# Patient Record
Sex: Female | Born: 1985 | Race: White | Hispanic: No | Marital: Single | Smoking: Never smoker
Health system: Southern US, Community
[De-identification: ages and names within clinical notes are randomized; demographics above are authoritative.]

## PROBLEM LIST (undated history)

## (undated) ENCOUNTER — Ambulatory Visit: Admission: EM | Payer: Medicaid Other

## (undated) DIAGNOSIS — F988 Other specified behavioral and emotional disorders with onset usually occurring in childhood and adolescence: Secondary | ICD-10-CM

## (undated) DIAGNOSIS — O9122 Nonpurulent mastitis associated with the puerperium: Secondary | ICD-10-CM

## (undated) DIAGNOSIS — F410 Panic disorder [episodic paroxysmal anxiety] without agoraphobia: Secondary | ICD-10-CM

## (undated) DIAGNOSIS — D6851 Activated protein C resistance: Secondary | ICD-10-CM

## (undated) DIAGNOSIS — L404 Guttate psoriasis: Secondary | ICD-10-CM

## (undated) HISTORY — DX: Activated protein C resistance: D68.51

## (undated) HISTORY — DX: Nonpurulent mastitis associated with the puerperium: O91.22

## (undated) HISTORY — DX: Other specified behavioral and emotional disorders with onset usually occurring in childhood and adolescence: F98.8

## (undated) HISTORY — PX: TONSILLECTOMY: SUR1361

## (undated) HISTORY — PX: SINUS IRRIGATION: SHX2411

## (undated) HISTORY — PX: WISDOM TOOTH EXTRACTION: SHX21

## (undated) HISTORY — DX: Guttate psoriasis: L40.4

---
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
## 2007-06-25 ENCOUNTER — Emergency Department: Payer: Self-pay | Admitting: Emergency Medicine

## 2007-12-04 ENCOUNTER — Emergency Department: Payer: Self-pay | Admitting: Internal Medicine

## 2007-12-26 ENCOUNTER — Ambulatory Visit: Payer: Self-pay | Admitting: Internal Medicine

## 2008-01-10 ENCOUNTER — Ambulatory Visit: Payer: Self-pay

## 2008-03-30 ENCOUNTER — Emergency Department: Payer: Self-pay | Admitting: Emergency Medicine

## 2008-05-11 ENCOUNTER — Emergency Department: Payer: Self-pay | Admitting: Emergency Medicine

## 2008-05-11 ENCOUNTER — Emergency Department (HOSPITAL_COMMUNITY): Admission: EM | Admit: 2008-05-11 | Discharge: 2008-05-11 | Payer: Self-pay | Admitting: Emergency Medicine

## 2008-06-27 ENCOUNTER — Ambulatory Visit: Payer: Self-pay | Admitting: Surgery

## 2008-07-05 ENCOUNTER — Ambulatory Visit: Payer: Self-pay | Admitting: Surgery

## 2008-12-29 ENCOUNTER — Emergency Department (HOSPITAL_COMMUNITY): Admission: EM | Admit: 2008-12-29 | Discharge: 2008-12-30 | Payer: Self-pay | Admitting: Emergency Medicine

## 2009-03-20 ENCOUNTER — Ambulatory Visit: Payer: Self-pay | Admitting: Internal Medicine

## 2009-05-11 ENCOUNTER — Ambulatory Visit: Payer: Self-pay | Admitting: Family Medicine

## 2009-07-22 ENCOUNTER — Emergency Department (HOSPITAL_COMMUNITY): Admission: EM | Admit: 2009-07-22 | Discharge: 2009-07-22 | Payer: Self-pay | Admitting: Emergency Medicine

## 2009-08-17 ENCOUNTER — Ambulatory Visit: Payer: Self-pay | Admitting: Internal Medicine

## 2009-10-18 ENCOUNTER — Ambulatory Visit: Payer: Self-pay | Admitting: Internal Medicine

## 2010-01-06 ENCOUNTER — Emergency Department (HOSPITAL_COMMUNITY): Admission: EM | Admit: 2010-01-06 | Discharge: 2010-01-07 | Payer: Self-pay | Admitting: Emergency Medicine

## 2010-01-08 ENCOUNTER — Emergency Department (HOSPITAL_COMMUNITY): Admission: EM | Admit: 2010-01-08 | Discharge: 2010-01-08 | Payer: Self-pay | Admitting: Emergency Medicine

## 2010-08-28 ENCOUNTER — Ambulatory Visit: Payer: Self-pay | Admitting: Unknown Physician Specialty

## 2010-08-29 ENCOUNTER — Emergency Department: Payer: Self-pay | Admitting: Internal Medicine

## 2010-09-03 ENCOUNTER — Emergency Department: Payer: Self-pay | Admitting: Emergency Medicine

## 2010-09-13 LAB — CULTURE, ROUTINE-ABSCESS

## 2011-03-07 ENCOUNTER — Emergency Department (HOSPITAL_COMMUNITY): Payer: BC Managed Care – PPO

## 2011-03-07 ENCOUNTER — Emergency Department (HOSPITAL_COMMUNITY)
Admission: EM | Admit: 2011-03-07 | Discharge: 2011-03-07 | Disposition: A | Discharge status: Home / Self Care | Payer: BC Managed Care – PPO | Attending: Emergency Medicine | Admitting: Emergency Medicine

## 2011-03-07 DIAGNOSIS — IMO0001 Reserved for inherently not codable concepts without codable children: Secondary | ICD-10-CM | POA: Insufficient documentation

## 2011-03-07 DIAGNOSIS — J3489 Other specified disorders of nose and nasal sinuses: Secondary | ICD-10-CM | POA: Insufficient documentation

## 2011-03-07 DIAGNOSIS — R6883 Chills (without fever): Secondary | ICD-10-CM | POA: Insufficient documentation

## 2011-03-07 DIAGNOSIS — R059 Cough, unspecified: Secondary | ICD-10-CM | POA: Insufficient documentation

## 2011-03-07 DIAGNOSIS — R05 Cough: Secondary | ICD-10-CM | POA: Insufficient documentation

## 2011-03-07 DIAGNOSIS — B86 Scabies: Secondary | ICD-10-CM | POA: Insufficient documentation

## 2011-03-07 DIAGNOSIS — R0602 Shortness of breath: Secondary | ICD-10-CM | POA: Insufficient documentation

## 2011-03-07 DIAGNOSIS — J029 Acute pharyngitis, unspecified: Secondary | ICD-10-CM | POA: Insufficient documentation

## 2011-03-07 DIAGNOSIS — B009 Herpesviral infection, unspecified: Secondary | ICD-10-CM | POA: Insufficient documentation

## 2011-03-07 LAB — CBC
HCT: 40.3 % (ref 36.0–46.0)
MCV: 90.2 fL (ref 78.0–100.0)
Platelets: 217 10*3/uL (ref 150–400)
RBC: 4.47 MIL/uL (ref 3.87–5.11)
WBC: 5.7 10*3/uL (ref 4.0–10.5)

## 2011-03-07 LAB — URINALYSIS, ROUTINE W REFLEX MICROSCOPIC
Bilirubin Urine: NEGATIVE
Hgb urine dipstick: NEGATIVE
Nitrite: NEGATIVE
Specific Gravity, Urine: 1.026 (ref 1.005–1.030)
pH: 5.5 (ref 5.0–8.0)

## 2011-03-07 LAB — POCT I-STAT, CHEM 8
BUN: 13 mg/dL (ref 6–23)
Chloride: 104 mEq/L (ref 96–112)
Creatinine, Ser: 0.8 mg/dL (ref 0.50–1.10)
Potassium: 3.8 mEq/L (ref 3.5–5.1)
Sodium: 140 mEq/L (ref 135–145)

## 2011-05-25 ENCOUNTER — Encounter: Payer: Self-pay | Admitting: Emergency Medicine

## 2011-05-25 ENCOUNTER — Emergency Department (HOSPITAL_COMMUNITY)
Admission: EM | Admit: 2011-05-25 | Discharge: 2011-05-25 | Discharge status: Against Medical Advice | Payer: BC Managed Care – PPO | Attending: Emergency Medicine | Admitting: Emergency Medicine

## 2011-05-25 DIAGNOSIS — Z0389 Encounter for observation for other suspected diseases and conditions ruled out: Secondary | ICD-10-CM | POA: Insufficient documentation

## 2011-05-25 HISTORY — DX: Panic disorder (episodic paroxysmal anxiety): F41.0

## 2011-05-25 NOTE — ED Notes (Signed)
Pt. reports panic/anxiety attack this evening , no meds prior to arrival.

## 2012-02-12 ENCOUNTER — Observation Stay: Payer: Self-pay | Admitting: Obstetrics and Gynecology

## 2012-02-25 ENCOUNTER — Inpatient Hospital Stay: Payer: Self-pay

## 2012-02-25 LAB — CBC WITH DIFFERENTIAL/PLATELET
Basophil %: 0.1 %
Eosinophil %: 0.3 %
HGB: 11.7 g/dL — ABNORMAL LOW (ref 12.0–16.0)
Lymphocyte #: 2.3 10*3/uL (ref 1.0–3.6)
MCH: 30.7 pg (ref 26.0–34.0)
MCV: 89 fL (ref 80–100)
Monocyte #: 1.1 x10 3/mm — ABNORMAL HIGH (ref 0.2–0.9)
RBC: 3.81 10*6/uL (ref 3.80–5.20)

## 2012-02-27 DIAGNOSIS — O9122 Nonpurulent mastitis associated with the puerperium: Secondary | ICD-10-CM

## 2012-02-27 HISTORY — DX: Nonpurulent mastitis associated with the puerperium: O91.22

## 2012-02-27 LAB — HEMATOCRIT: HCT: 28.6 % — ABNORMAL LOW

## 2012-03-13 ENCOUNTER — Inpatient Hospital Stay: Payer: Self-pay

## 2012-03-13 LAB — BASIC METABOLIC PANEL
Anion Gap: 10 (ref 7–16)
BUN: 10 mg/dL (ref 7–18)
Chloride: 104 mmol/L (ref 98–107)
Glucose: 94 mg/dL (ref 65–99)
Potassium: 2.8 mmol/L — ABNORMAL LOW (ref 3.5–5.1)
Sodium: 141 mmol/L (ref 136–145)

## 2012-03-13 LAB — URINALYSIS, COMPLETE
Nitrite: NEGATIVE
Ph: 6 (ref 4.5–8.0)
Specific Gravity: 1.012 (ref 1.003–1.030)
Squamous Epithelial: 1

## 2012-03-14 LAB — BASIC METABOLIC PANEL
Anion Gap: 10 (ref 7–16)
Calcium, Total: 8.2 mg/dL — ABNORMAL LOW (ref 8.5–10.1)
Co2: 26 mmol/L (ref 21–32)
EGFR (African American): >60
EGFR (Non-African Amer.): >60
Glucose: 140 mg/dL — ABNORMAL HIGH (ref 65–99)
Osmolality: 285 (ref 275–301)

## 2012-03-14 LAB — CBC WITH DIFFERENTIAL/PLATELET
Basophil #: 0 10*3/uL (ref 0.0–0.1)
Eosinophil %: 1.2 %
Lymphocyte %: 7.3 %
MCV: 88 fL (ref 80–100)
Monocyte %: 5.5 %
Neutrophil %: 85.8 %
Platelet: 199 10*3/uL (ref 150–440)
RBC: 3.34 10*6/uL — ABNORMAL LOW (ref 3.80–5.20)
RDW: 13.9 % (ref 11.5–14.5)
WBC: 11.1 10*3/uL — ABNORMAL HIGH (ref 3.6–11.0)

## 2012-03-16 LAB — ELECTROLYTE PANEL
Anion Gap: 8 (ref 7–16)
Chloride: 109 mmol/L — ABNORMAL HIGH (ref 98–107)
Co2: 26 mmol/L (ref 21–32)
Potassium: 3.6 mmol/L (ref 3.5–5.1)
Sodium: 143 mmol/L (ref 136–145)

## 2012-03-19 LAB — CULTURE, BLOOD (SINGLE)

## 2012-06-25 ENCOUNTER — Emergency Department: Payer: Self-pay | Admitting: Emergency Medicine

## 2012-06-28 HISTORY — PX: MANDIBLE SURGERY: SHX707

## 2012-06-29 ENCOUNTER — Emergency Department: Payer: Self-pay | Admitting: Emergency Medicine

## 2012-06-29 LAB — COMPREHENSIVE METABOLIC PANEL
Albumin: 4.4 g/dL (ref 3.4–5.0)
Anion Gap: 11 (ref 7–16)
Creatinine: 0.66 mg/dL (ref 0.60–1.30)
Glucose: 112 mg/dL — ABNORMAL HIGH (ref 65–99)
SGOT(AST): 27 U/L (ref 15–37)
Total Protein: 8.1 g/dL (ref 6.4–8.2)

## 2012-06-29 LAB — URINALYSIS, COMPLETE
Bacteria: NONE SEEN
Blood: NEGATIVE
Glucose,UR: NEGATIVE mg/dL (ref 0–75)
Nitrite: NEGATIVE
RBC,UR: 1 /HPF (ref 0–5)
Squamous Epithelial: 3

## 2012-06-29 LAB — CBC
HCT: 41.8 % (ref 35.0–47.0)
HGB: 14.4 g/dL (ref 12.0–16.0)
MCH: 30.3 pg (ref 26.0–34.0)
MCHC: 34.5 g/dL (ref 32.0–36.0)
MCV: 88 fL (ref 80–100)
RBC: 4.75 10*6/uL (ref 3.80–5.20)

## 2012-06-29 LAB — LIPASE, BLOOD: Lipase: 94 U/L (ref 73–393)

## 2013-03-23 ENCOUNTER — Emergency Department (HOSPITAL_COMMUNITY)
Admission: EM | Admit: 2013-03-23 | Discharge: 2013-03-23 | Disposition: A | Discharge status: Home / Self Care | Payer: Managed Care, Other (non HMO) | Attending: Emergency Medicine | Admitting: Emergency Medicine

## 2013-03-23 ENCOUNTER — Encounter (HOSPITAL_COMMUNITY): Payer: Self-pay | Admitting: *Deleted

## 2013-03-23 DIAGNOSIS — Z79899 Other long term (current) drug therapy: Secondary | ICD-10-CM | POA: Insufficient documentation

## 2013-03-23 DIAGNOSIS — F410 Panic disorder [episodic paroxysmal anxiety] without agoraphobia: Secondary | ICD-10-CM | POA: Insufficient documentation

## 2013-03-23 DIAGNOSIS — F17200 Nicotine dependence, unspecified, uncomplicated: Secondary | ICD-10-CM | POA: Insufficient documentation

## 2013-03-23 DIAGNOSIS — R21 Rash and other nonspecific skin eruption: Secondary | ICD-10-CM

## 2013-03-23 MED ORDER — IBUPROFEN 600 MG PO TABS: 600.0000 mg | ORAL_TABLET | Freq: Four times a day (QID) | ORAL | Status: DC | PRN

## 2013-03-23 MED ORDER — HYDROCORTISONE 1 % EX CREA: TOPICAL_CREAM | CUTANEOUS | Status: DC

## 2013-03-23 NOTE — ED Provider Notes (Signed)
CSN: [id number]     Arrival date & time 03/23/13  0006 History   First MD Initiated Contact with Patient 03/23/13 0049     Chief Complaint  Patient presents with  . Leg Pain  . Arm Pain  . Rash    HPI Patient presents to the emergency room with complaints of pain in her bilateral inner thighs as well as her left forearm. Patient states she felt like her veins were urinated. She palpates them and has some discomfort. She has not noticed any redness or swelling. She has not had any chest pain or shortness of breath.  She was reading on the Internet about blood clots and this concerned her so she came to the emergency department. Patient also has had this slight rash in the left antecubital fossa. She has a history of eczema but has never had a rash here before. Past Medical History  Diagnosis Date  . Panic attack   . Anxiety attack    Past Surgical History  Procedure Laterality Date  . Tonsillectomy    . Sinus irrigation     No family history on file. History  Substance Use Topics  . Smoking status: Current Every Day Smoker  . Smokeless tobacco: Not on file  . Alcohol Use: Yes     Comment: occasional   OB History   Grav Para Term Preterm Abortions TAB SAB Ect Mult Living                 Review of Systems  All other systems reviewed and are negative.    Allergies  Doxycycline  Home Medications   Current Outpatient Rx  Name  Route  Sig  Dispense  Refill  . amphetamine-dextroamphetamine (ADDERALL) 10 MG tablet   Oral   Take 10 mg by mouth 2 (two) times daily.         . clonazePAM (KLONOPIN) 0.5 MG tablet   Oral   Take 0.5 mg by mouth 2 (two) times daily as needed for anxiety.         . hydrocortisone cream 1 %      Apply to affected area 2 times daily   15 g   0   . ibuprofen (ADVIL,MOTRIN) 600 MG tablet   Oral   Take 1 tablet (600 mg total) by mouth every 6 (six) hours as needed for pain.   30 tablet   0    BP 123/59  Pulse 86  Temp(Src) 98.2 F  (36.8 C) (Oral)  Resp 15  Ht 5\' 5"  (1.651 m)  Wt 145 lb (65.772 kg)  BMI 24.13 kg/m2  SpO2 100%  LMP 02/19/2013 Physical Exam  Nursing note and vitals reviewed. Constitutional: She appears well-developed and well-nourished. No distress.  HENT:  Head: Normocephalic and atraumatic.  Right Ear: External ear normal.  Left Ear: External ear normal.  Eyes: Conjunctivae are normal. Right eye exhibits no discharge. Left eye exhibits no discharge. No scleral icterus.  Neck: Neck supple. No tracheal deviation present.  Cardiovascular: Normal rate, regular rhythm and intact distal pulses.   Pulmonary/Chest: Effort normal and breath sounds normal. No stridor. No respiratory distress. She has no wheezes. She has no rales.  Abdominal: Soft. Bowel sounds are normal. She exhibits no distension. There is no tenderness. There is no rebound and no guarding.  Musculoskeletal: She exhibits no edema and no tenderness.  Neurological: She is alert. She has normal strength. No sensory deficit. Cranial nerve deficit:  no gross defecits noted. She  exhibits normal muscle tone. She displays no seizure activity. Coordination normal.  Skin: Skin is warm and dry. No rash noted.  Psychiatric: She has a normal mood and affect.    ED Course  Procedures (including critical care time) Labs Review Labs Reviewed - No data to display Imaging Review No results found.  MDM   1. Rash    Patient has no erythema or tenderness on exam. She has no cords. I doubt venous thrombosis. I doubt thrombophlebitis. I will give her prescription for hydrocortisone for her rash. At this time I doubt any acute emergency medical condition. I recommend follow up with her primary Dr.    Celene Kras, MD 03/23/13 [phone]

## 2013-03-23 NOTE — ED Notes (Signed)
Discharge e-sign pad not working pt unable to sign discharge instructions and Rx given to pt.

## 2013-03-23 NOTE — ED Notes (Signed)
Pt states yesterday started having leg pain, then today started having L lower inner "vein" pain, states she read online it could be a blood clot and w/ her anxiety she's worried, pt also states she has a rash that is itchy.

## 2013-05-19 ENCOUNTER — Emergency Department: Payer: Self-pay | Admitting: Emergency Medicine

## 2013-05-19 LAB — COMPREHENSIVE METABOLIC PANEL
Albumin: 4 g/dL (ref 3.4–5.0)
Alkaline Phosphatase: 93 U/L
Anion Gap: 1 — ABNORMAL LOW (ref 7–16)
BUN: 20 mg/dL — ABNORMAL HIGH (ref 7–18)
Bilirubin,Total: 0.4 mg/dL (ref 0.2–1.0)
Calcium, Total: 9 mg/dL (ref 8.5–10.1)
Chloride: 104 mmol/L (ref 98–107)
Co2: 32 mmol/L (ref 21–32)
EGFR (African American): >60
EGFR (Non-African Amer.): >60
SGOT(AST): 26 U/L (ref 15–37)
SGPT (ALT): 28 U/L (ref 12–78)
Sodium: 137 mmol/L (ref 136–145)
Total Protein: 7.5 g/dL (ref 6.4–8.2)

## 2013-05-19 LAB — URINALYSIS, COMPLETE
Bacteria: NONE SEEN
Bilirubin,UR: NEGATIVE
Glucose,UR: NEGATIVE mg/dL (ref 0–75)
Leukocyte Esterase: NEGATIVE
RBC,UR: 1 /HPF (ref 0–5)
WBC UR: <1 /HPF (ref 0–5)

## 2013-05-19 LAB — CBC
HGB: 12.4 g/dL (ref 12.0–16.0)
MCHC: 34.3 g/dL (ref 32.0–36.0)
MCV: 90 fL (ref 80–100)
WBC: 4.6 10*3/uL (ref 3.6–11.0)

## 2013-08-22 ENCOUNTER — Emergency Department: Payer: Self-pay | Admitting: Emergency Medicine

## 2013-11-08 DIAGNOSIS — G8929 Other chronic pain: Secondary | ICD-10-CM | POA: Insufficient documentation

## 2013-11-08 DIAGNOSIS — R109 Unspecified abdominal pain: Secondary | ICD-10-CM

## 2014-09-03 ENCOUNTER — Ambulatory Visit: Payer: Self-pay | Admitting: Internal Medicine

## 2014-09-13 ENCOUNTER — Ambulatory Visit: Payer: Self-pay | Admitting: Internal Medicine

## 2014-09-27 ENCOUNTER — Ambulatory Visit
Admit: 2014-09-27 | Disposition: A | Discharge status: Home / Self Care | Payer: Self-pay | Attending: Internal Medicine | Admitting: Internal Medicine

## 2014-10-14 ENCOUNTER — Encounter
Admit: 2014-10-14 | Disposition: A | Discharge status: Home / Self Care | Payer: Self-pay | Attending: Family Medicine | Admitting: Family Medicine

## 2014-10-15 NOTE — Op Note (Signed)
PATIENT NAME:  Cynthia Davidson, Cynthia Davidson MR#:  [id number] DATE OF BIRTH:  27-Oct-1985  DATE OF PROCEDURE:  02/26/2012  PREOPERATIVE DIAGNOSES:  71. 29 year old G1 at 40 weeks, 2 days gestation.  2. Failed induction of labor secondary to presumed macrosomia.  3. Active phase arrest secondary to cephalopelvic portion.   POSTOPERATIVE DIAGNOSES: 60. 28 year old G1 at 40 weeks, 2 days gestation.  2. Failed induction of labor secondary to presumed macrosomia.  3. Active phase arrest secondary to cephalopelvic portion.   PROCEDURE PERFORMED: Primary low transverse Cesarean section via Pfannenstiel skin incision.   ANESTHESIA USED: Spinal.   PRIMARY SURGEON: Stoney Bang. Georgianne Fick, MD    ASSISTANT: Dalia Heading, CNM   ESTIMATED BLOOD LOSS: 700 mL.  OPERATIVE FLUIDS: 1400 mL of Crystalloid.   URINE OUTPUT: 200 mL.   COMPLICATIONS: None.   DRAINS OR TUBES: Foley to gravity drainage; On-Q catheter.   IMPLANTS: None.   INTRAOPERATIVE FINDINGS: Normal tubes, ovaries, and uterus, infant in the OA position. Delivery resulted in the birth of a liveborn female infant weighing 3680 grams, 8 pounds 2 ounces, Apgars 9 and 9.   SPECIMENS REMOVED: None.   CONDITION FOLLOWING PROCEDURE: Stable.   PROCEDURE IN DETAIL: Risks, benefits, and alternatives of the procedure were discussed with the patient prior to proceeding to the OR. The patient was administered spinal anesthesia and then positioned in the supine position. She was prepped and draped in the usual sterile fashion. Prior to proceeding with the case, local anesthetic was tested and noted to be adequate. A Pfannenstiel skin incision was made 2 cm above the pubic symphysis, carried down sharply to the level of the rectus fascia using the knife. The fascia was incised in the midline using the knife. The fascial incision was then extended using Mayo scissors. The superior border of the rectus fascia was grasped with two Kocher clamps. The underlying  rectus muscle was bluntly dissected off the fascia and the median raphe incised using Mayo scissors. The same procedure was then repeated for the inferior border of the rectus fascia bringing it off the rectus muscle in a similar manner. Next, the midline was identified and the peritoneum was entered bluntly. The peritoneal window was then extended using manual traction. A bladder blade was placed. A bladder flap was created using Metzenbaum scissors and developed digitally. The bladder blade was then replaced. Following this a hysterotomy incision was made on the uterus using a scalpel. The uterus was entered bluntly using the operator's finger. Upon placing the operator's hand into the hysterotomy incision, infant was noted to be in the OA position. The vertex was grasped, flexed, brought to the incision, and delivered atraumatically using fundal pressure. The remainder of the body was delivered without difficulty. The infant was suctioned, the cord was clamped and cut, and the infant was passed to the awaiting pediatrician. Next, the cord blood was obtained. The placenta was delivered using manual extraction. The uterus was exteriorized, wiped clean of clots and debris, and the hysterotomy incision repaired using a two layer closure of 0 Vicryl with the first being a running locked and the second a vertical imbricating. Following closure of the hysterotomy, the pelvis was irrigated with warm saline. The uterus was replaced into the abdomen. The hysterotomy incision was inspected and noted to be hemostatic. The peritoneum was then closed using a 2-0 Vicryl in a running fashion. The superior border of the rectus fascia was grasped with a Kocher clamp and the On-Q trocars were passed approximately 4  cm above the superior border of the Pfannenstiel skin incision in the midline. The trocars were removed and the On-Q catheters threaded through the introducers after which the introducers were removed. The rectus muscle  was inspected and noted to be hemostatic. The fascia was closed using a #1 looped PDS in a running fashion. Following fascial closure, the subcutaneous tissue was irrigated and hemostasis was achieved using the Bovie. Skin was closed using a 4-0 Monocryl in a subcuticular fashion. The Pfannenstiel skin incision was dressed with Steri-Strips. The On-Q catheters were dressed with Dermabond and then Steri-Strips. A Tegaderm was placed over the On-Q catheters. Each On-Q catheter was then bolused with 5 mL of 0.5% bupivacaine each. Sponge, needle, and instrument counts were correct x2. The patient tolerated the procedure well and was taken to recovery room in stable condition.    ____________________________ Stoney Bang. Georgianne Fick, MD ams:drc D: 02/28/2012 09:19:00 ET T: 02/29/2012 08:42:03 ET JOB#: [id number]  cc: Stoney Bang. Georgianne Fick, MD, <Dictator> Dorthula Nettles MD ELECTRONICALLY SIGNED 03/21/2012 21:13

## 2014-10-15 NOTE — Consult Note (Signed)
PATIENT NAME:  Cynthia Davidson, Cynthia Davidson MR#:  [id number] DATE OF BIRTH:  01-19-1986  DATE OF CONSULTATION:  03/13/2012  REFERRING PHYSICIAN:   CONSULTING PHYSICIAN:  Jerrol Banana. Burt Knack, MD  CHIEF COMPLAINT: Left breast pain.   HISTORY OF PRESENT ILLNESS: This is a patient who is two weeks postpartum who since last Thursday has had increasing left breast pain and tenderness. She denies discharge other than normal lactational products. She has stopped breast-feeding due to being on antibiotics. She has never had anything like this before. She describes fevers to 104, but that is not documented here in the hospital. She states that she believes her fever broke today and she has been profusely sweating.  Of note, she has had a left breast biopsy in the past, excisional in nature, unknown pathology but presumed benign.    PAST MEDICAL HISTORY: None.   PAST SURGICAL HISTORY: Cesarean section.   ALLERGIES: Doxycycline.   FAMILY HISTORY: Noncontributory.   SOCIAL HISTORY: The patient does not drink alcohol.   REVIEW OF SYSTEMS: 10-system review was performed and negative with the exception of that mentioned in the history of present illness including fevers.   PHYSICAL EXAMINATION:  GENERAL: Healthy, but uncomfortable-appearing Caucasian female patient. Her baby is in the room with her. Temperature of 98.8 and 98.6, pulse 107, respirations 20, blood pressure 91/61.   HEENT: No scleral icterus.   NECK: No palpable neck nodes.   BREASTS: Examination shows erythema and diffuse tenderness with probable fluctuance laterally beneath a well-healed prior biopsy scar. Some of this erythema extends to the left axilla, diffusely tender.   EXTREMITIES: Minimal edema.   NEUROLOGIC: Grossly intact.   INTEGUMENT: No jaundice. Cellulitis is noted as above.   LABORATORY, DIAGNOSTIC, AND RADIOLOGICAL DATA: Laboratory values were being drawn as I was seeing the patient including blood cultures and presumed  standard blood panels. They are not returned yet and will be reviewed shortly.   ASSESSMENT AND PLAN: Apparently Dr. Marina Gravel had spoken to the patient's obstetrician concerning postpartum mastitis. She is seen in the hospital, has been admitted by the obstetrician for a diagnosis of postpartum mastitis on the left. She clearly has that, and I believe that she likely has an abscess cavity. She has been on antibiotics since Friday with no improvement other than her breaking fevers, but worsening pain. In this situation I would recommend and have recommended to the patient and her friend or family member that we proceed with incision and drainage tomorrow morning. She is very reluctant to consider surgery at this point. She does not want to have surgery and wishes to attempt continued IV antibiotic therapy with the hopes that this clears up with IV antibiotics.   I discussed with the patient the possibility of doing that, but the likelihood is that this is postpartum mastitis with an abscess cavity and an incision and drainage will need to be done, and proper drainage could result in a quicker resolution to this problem. Again, she is reluctant to do that. I did not consent her for surgery, but I will make her n.p.o. after midnight and plan surgery for her tomorrow if she does not drastically and dramatically improve. Additionally I will order an ultrasound for first thing in the morning to confirm the presence of an abscess cavity, which is likely. The patient was in agreement with this plan and did not want to consent to surgery at this point, and will be re-evaluated first thing in the morning by Dr. Marina Gravel.  ____________________________ Jerrol Banana Burt Knack, MD rec:bjt D:  03/13/2012 21:33:24 ET         T: 03/14/2012 09:57:35 ET         JOB#: [id number]   Florene Glen MD ELECTRONICALLY SIGNED 03/14/2012 21:01

## 2014-10-15 NOTE — Consult Note (Signed)
Brief Consult Note: Diagnosis: lt ppartum mastitis.   Patient was seen by consultant.   Consult note dictated.   Recommend to proceed with surgery or procedure.   Orders entered.   Comments: pt reluctant to consider surgery at this time. wants to see if  IV abx will improve mastitis. Will make ready for I&D but if improved will hold off. Decision to be made in am. Will order U/S of breast as well but likely abscess cavity present.  Electronic Signatures: Florene Glen (MD)  (Signed 16-Sep-13 21:26)  Authored: Brief Consult Note   Last Updated: 16-Sep-13 21:26 by Florene Glen (MD)

## 2014-11-05 NOTE — H&P (Signed)
L&D Evaluation:  History:   HPI 29yo G5P0040 at [redacted]w[redacted]d by first trimester U/S presents for scheduled IOL for post-dates and LGA baby.  PNC at [facility], uncomplicated.    PNL: O+ / VNI / RI / Hep B - / HIV - / RPR NR / GBS -    Patient's Medical History anxiety - not on meds    Patient's Surgical History D&C  T&A, breast biopsy x 2    Medications Pre Natal Vitamins    Allergies doxycyline    Social History none    Family History Non-Contributory   ROS:   ROS All systems were reviewed.  HEENT, CNS, GI, GU, Respiratory, CV, Renal and Musculoskeletal systems were found to be normal.   Exam:   Vital Signs stable    General no apparent distress    Mental Status clear    Chest clear    Heart normal sinus rhythm    Abdomen gravid, non-tender    Estimated Fetal Weight Average for gestational age, 8#    Fetal Position vtx    Pelvic 1 / 64 / -3    Mebranes Intact    FHT Description 140 moderate variability, + accels, no decels    Ucx every 2 mins   Impression:   Impression 29yo G5P0040 at [redacted]w[redacted]d presents for scheduled IOL   Plan:   Plan contracting too much for cervidil, will start with foley/pitocin, reviewed risks/benefits of IOL, pt understands and accepts    Comments VNI - will need PP varicella vaccine, s/p TDAP 01/10/12   Electronic Signatures: Thurmond Butts, Elinor Parkinson (MD)  (Signed 30-Aug-13 21:54)  Authored: L&D Evaluation   Last Updated: 30-Aug-13 21:54 by Thurmond Butts, Elinor Parkinson (MD)

## 2014-11-12 ENCOUNTER — Ambulatory Visit: Payer: No Typology Code available for payment source | Attending: Family Medicine

## 2014-11-12 DIAGNOSIS — X58XXXD Exposure to other specified factors, subsequent encounter: Secondary | ICD-10-CM | POA: Insufficient documentation

## 2014-11-12 DIAGNOSIS — S39012D Strain of muscle, fascia and tendon of lower back, subsequent encounter: Secondary | ICD-10-CM | POA: Insufficient documentation

## 2014-11-12 DIAGNOSIS — M6281 Muscle weakness (generalized): Secondary | ICD-10-CM | POA: Diagnosis not present

## 2014-11-12 DIAGNOSIS — M545 Low back pain: Secondary | ICD-10-CM | POA: Insufficient documentation

## 2014-11-12 NOTE — Patient Instructions (Addendum)
Trunk: Rotation   Lie on back on firm, flat surface with knees bent. Keep head and shoulders flat, slowly lower knees to floor. May also do with legs straight. Lift one at a time up and across to touch floor. Hold _5___ seconds. Repeat ___10_ times, alternating sides. Do _3___ sessions per day. CAUTION: Movement should be gentle, steady and slow.  Copyright  VHI. All rights reserved.    Also gave single leg bridge (using L LE for L glute max and R hip flexors to correct posterior nutation R innominate, and anterior nutation L innominate) and R S/L open book exercise as part of her HEP. Patient demonstrated and verbalized understanding.   Improved exercise technique, movement at target joints, use of target muscles after mod verbal, visual, tactile cues.

## 2014-11-12 NOTE — Therapy (Signed)
[facility] [address] [place], [address] Phone: [phone]   Fax:  [phone]  Physical Therapy Treatment  Patient Details  Name: Cynthia Davidson MRN: [id number] Date of Birth: 10-31-1985 Referring Provider:  Birdie Sons, MD  Encounter Date: 11/12/2014      PT End of Session - 11/12/14 1441    Visit Number 4   Number of Visits 12   Date for PT Re-Evaluation 12/10/14   PT Start Time 4034   PT Stop Time 1545   PT Time Calculation (min) 67 min   Activity Tolerance Patient tolerated treatment well;No increased pain   Behavior During Therapy [facility] for tasks assessed/performed      Past Medical History  Diagnosis Date  . Panic attack   . Anxiety attack     Past Surgical History  Procedure Laterality Date  . Tonsillectomy    . Sinus irrigation    . Wisdom tooth extraction    . Biopsy breast      There were no vitals filed for this visit.  Visit Diagnosis:  Low back pain, unspecified back pain laterality, with sciatica presence unspecified - Plan: PT plan of care cert/re-cert  Muscle weakness (generalized) - Plan: PT plan of care cert/re-cert  Lumbar spine strain, subsequent encounter - Plan: PT plan of care cert/re-cert      Subjective Assessment - 11/12/14 1442    Subjective 2/10 back pain current, 5/10 at worst past 2-3 days (when patient woke up in the morning; patient sleeps on her side). Also goes to a chiropractor which helps her back. Back feel stiff especially the upper back.    Patient Stated Goals "I just want my pain to go away."   Currently in Pain? Yes   Pain Score 2    Pain Location Back            [facility] PT Assessment - 11/12/14 1637    Assessment   Medical Diagnosis Strain of muscle, fascia and tendon of lower back subsequent encounter; low back pain; muscle weakness   Onset Date 09/19/14   Prior Therapy Patient currently participates in chiropractic care as  well   Precautions   Precaution Comments No known precautions   Restrictions   Other Position/Activity Restrictions No known restrictions   Balance Screen   Has the patient fallen in the past 6 months No   Has the patient had a decrease in activity level because of a fear of falling?  No   Is the patient reluctant to leave their home because of a fear of falling?  No   Prior Function   Vocation Requirements full function   Observation/Other Assessments   Observations Positive long sit test suggesing anterior nutation of L innominate, and posterior nutation of R innominate; (-) repeated flexion test, (-) slump test bilaterally; SLR hip flexion suggests bilateral neural tension   Oswestry Disability Index  during initial evaluation: 32% (moderate disability)   AROM   Overall AROM Comments During initial evaluation: lumbar flexion WFL but R paraspinals more prominent suggesing possible scoliosis; lumbar extension limited with decreaesd thoracic extension. Lumbar side bending and rotation WFL both R and L directions and without reproduction of symptoms.    Strength   Overall Strength Comments During initial evaluation: hip abduction 4+/5 bilaterally, glute max extension 4/5 bilaterally   Ambulation/Gait   Gait Comments increased R pelvic rotation           [facility] Adult  PT Treatment/Exercise - 11/12/14 1511    Knee/Hip Exercises: Supine   Other Supine Knee Exercises Directed patient with supine bridge with horizontal towel roll upper back 10x2, then with vertical towel roll R paraspinals 10x, supine lower trunk rotation 10x2 with 5 second holds, single leg bridge using L LE with R hip flexed as high as L thigh 10x2   Knee/Hip Exercises: Sidelying   Other Sidelying Knee Exercises R S/L open books 10x2   Other Sidelying Knee Exercises Reviewed current status/progress with LE strength with patient.    Knee/Hip Exercises: Prone   Other Prone Exercises Directed patient with quadruped L trunk  rotation with static head 10x3, planks 15 seconds x2, and 30 seconds x1   Manual Therapy   Manual therapy comments Medial glide to L patella with knee flexed grade 4  Decreased L knee popping with supine bridge afterwards             PT Education - 11/12/14 1558    Education provided Yes   Education Details Ther-ex, HEP, pelvic positionning, thoracic and lumbar trunk posture.    Person(s) Educated Patient   Methods Explanation;Demonstration;Tactile cues;Verbal cues;Handout   Comprehension Verbalized understanding;Returned demonstration             PT Long Term Goals - 11/12/14 1606    PT LONG TERM GOAL #1   Title Patient will be independent with her home exercise program to improve ability to tolerate prolonged standing, sitting, as well as to improve ability to lift her son    Time 4   Period Weeks   Status On-going   PT LONG TERM GOAL #2   Title Patient will report 3/10 or less back pain at worst to improve ability to tolerate prolonged sitting to study, as well as to improve ability 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    Time 4   Period Weeks   Status On-going   PT LONG TERM GOAL #3   Title Patient will report being able to sit for at least 1.5 hours or greater to improve ability to study for her classes    Time 4   Period Weeks   Status On-going   PT LONG TERM GOAL #4   Title Patient will improve Modified Oswestry Low Back Pain Disability questionnaire by at least 6 points as a demonstration of improved function   Time 4   Period Weeks   Status On-going               Plan - 11/12/14 1537    Clinical Impression Statement Patient unable to attend previous therapy  sessions but able to attend now and resuming PT. Current strength: 4+/5  bilateral hip abduction, glute max extension R 4+/5, L 4/5. Improved R  glute max strength since initial evaluation.  Patient tolerated session  well without  increase in back pain. Decreased L knee popping with supine  bridge after medial glide to L patella (in knee flexion). Decreased  upper  back stiffness and lower back discomfort after today's session (with  standing back extension; symptoms reproduced earlier). Palpation of  vertebrae: R rotated thoracic and  lumbar spine, posterior nutation of R innominate, anterior nutation of L  innominate. Positive long sit test suggesting posterior nutation of R  innominate. Continue skilled physical therapy services to decrease back  pain, and improve ability to sit and study as well as to perform standing  tasks, working the medication  cart,  and paitent transfers at work.    Patient demonstrates stiffness dominance.      Pt will benefit from skilled therapeutic intervention in order to improve on the following deficits Decreased range of motion;Impaired flexibility;Improper body mechanics;Postural dysfunction;Pain;Decreased strength   Rehab Potential Good   PT Frequency 2x / week   PT Duration 4 weeks   PT Treatment/Interventions Traction;ADLs/Self Care Home Management;Neuromuscular re-education;Ultrasound;Passive range of motion;Patient/family education;Functional mobility training;Cryotherapy;Therapeutic activities;Electrical Stimulation;Therapeutic exercise;Manual techniques;Moist Heat   PT Next Visit Plan promote L thoracic and lumbar rotation mobility, thoracic extension, medial patellar mobility (McConnell taping), ergonomic lifting.    Consulted and Agree with Plan of Care Patient        Problem List There are no active problems to display for this patient.   Ruben Mahler 11/12/2014, 6:55 PM  [facility] [address], [place], [address] Phone: [phone]   Fax:  [phone]

## 2014-11-14 ENCOUNTER — Ambulatory Visit: Payer: No Typology Code available for payment source

## 2014-11-14 DIAGNOSIS — M6281 Muscle weakness (generalized): Secondary | ICD-10-CM

## 2014-11-14 DIAGNOSIS — S39012D Strain of muscle, fascia and tendon of lower back, subsequent encounter: Secondary | ICD-10-CM

## 2014-11-14 DIAGNOSIS — M545 Low back pain: Secondary | ICD-10-CM

## 2014-11-15 NOTE — Patient Instructions (Addendum)
Improved exercise technique, movement at target joints, use of target muscles after mod verbal, visual, tactile cues.   Gave seated anterior/posterior and side to side pelvic tilts 10x3 each as part of her HEP. Patient demonstrated and verbalized understanding.

## 2014-11-15 NOTE — Therapy (Signed)
[place] [facility] [address], [place], [address] Phone: [phone]   Fax:  [phone]  Physical Therapy Treatment  Patient Details  Name: Cynthia Davidson MRN: [id number] Date of Birth: 05-31-1986 Referring Provider:  Birdie Sons, MD  Encounter Date: 11/14/2014      PT End of Session - 11/14/14 1519    Visit Number 5   Number of Visits 12   Date for PT Re-Evaluation 12/10/14   PT Start Time 7253   PT Stop Time 6644   PT Time Calculation (min) 50 min   Activity Tolerance Patient tolerated treatment well;No increased pain   Behavior During Therapy [facility] for tasks assessed/performed      Past Medical History  Diagnosis Date  . Panic attack   . Anxiety attack     Past Surgical History  Procedure Laterality Date  . Tonsillectomy    . Sinus irrigation    . Wisdom tooth extraction    . Biopsy breast      There were no vitals filed for this visit.  Visit Diagnosis:  Low back pain, unspecified back pain laterality, with sciatica presence unspecified  Muscle weakness (generalized)  Lumbar spine strain, subsequent encounter      Subjective Assessment - 11/14/14 1520    Subjective 3/10 low back and L upper back currently. Patient's friend jumped on her back causing 4/10 back pain yesterday    Patient Stated Goals "I just want my pain to go away."   Currently in Pain? Yes   Pain Score 3    Pain Location Back   Pain Orientation Right;Left           [facility] Adult PT Treatment/Exercise - 11/14/14 1549    Knee/Hip Exercises: Standing   Other Standing Knee Exercises standing bilateral shoulder extension with scapular retraction red band 10x2   Knee/Hip Exercises: Seated   Other Seated Knee Exercises seated anterior and posterior pelvic tilts 10x3, seated side to side pelvic tilts 10x3 each direction.    Knee/Hip Exercises: Sidelying   Other Sidelying Knee Exercises R S/L open books 10x3   Knee/Hip Exercises: Prone   Other Prone Exercises Directed patient with quadruped L trunk rotation with static head 10x3, quadruped arch and sags 10x3,   Manual Therapy   Manual therapy comments Medial glide bilateral patella  grade 4+. McConnell tape bilateral patella to promote medial glide.             PT Education - 11/15/14 1512    Education provided Yes   Education Details ther-ex, HEP   Person(s) Educated Patient   Methods Explanation;Demonstration;Tactile cues;Verbal cues   Comprehension Verbalized understanding;Returned demonstration          PT Long Term Goals - 11/12/14 1606    PT LONG TERM GOAL #1   Title Patient will be independent with her home exercise program to improve ability to tolerate prolonged standing, sitting, as well as to improve ability to lift her son    Time 4   Period Weeks   Status On-going   PT LONG TERM GOAL #2   Title Patient will report 3/10 or less back pain at worst to improve ability to tolerate prolonged sitting to study, as well as to improve ability 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 No complain of pain with improved back mobility at end of session.    Pt will benefit from skilled therapeutic intervention in order to improve on the following deficits Decreased range of motion;Impaired  flexibility;Improper body mechanics;Postural dysfunction;Pain;Decreased strength   Rehab Potential Good   PT Frequency 2x / week   PT Duration 4 weeks   PT Treatment/Interventions Traction;ADLs/Self Care Home Management;Neuromuscular re-education;Ultrasound;Passive range of motion;Patient/family education;Functional mobility training;Cryotherapy;Therapeutic activities;Electrical Stimulation;Therapeutic exercise;Manual techniques;Moist Heat   PT Next Visit Plan promote L thoracic and lumbar rotation mobility, thoracic extension, medial patellar mobility (McConnell taping), ergonomic lifting.    Consulted and Agree with Plan of Care Patient        Problem List There are no active problems to display for this patient.   Kyzer Blowe 11/15/2014, 3:18 PM  [place] [facility] [address] [place], [address] Phone: [phone]   Fax:  [phone]

## 2014-11-19 ENCOUNTER — Ambulatory Visit: Payer: No Typology Code available for payment source

## 2014-11-21 ENCOUNTER — Ambulatory Visit: Payer: No Typology Code available for payment source

## 2014-11-21 DIAGNOSIS — M545 Low back pain: Secondary | ICD-10-CM

## 2014-11-21 DIAGNOSIS — S39012D Strain of muscle, fascia and tendon of lower back, subsequent encounter: Secondary | ICD-10-CM | POA: Diagnosis not present

## 2014-11-21 DIAGNOSIS — M6281 Muscle weakness (generalized): Secondary | ICD-10-CM

## 2014-11-21 NOTE — Therapy (Signed)
[place] [facility] [address], [place], [address] Phone: [phone]   Fax:  [phone]  Physical Therapy Treatment  Patient Details  Name: Cynthia Davidson MRN: [id number] Date of Birth: [birth year] Referring Provider:  Birdie Sons, MD  Encounter Date: 11/21/2014      PT End of Session - 11/21/14 1551    Visit Number 6   Number of Visits 12   Date for PT Re-Evaluation 12/10/14   PT Start Time 6962   PT Stop Time 9528   PT Time Calculation (min) 50 min   Activity Tolerance Patient tolerated treatment well;No increased pain   Behavior During Therapy [facility] for tasks assessed/performed      Past Medical History  Diagnosis Date  . Panic attack   . Anxiety attack     Past Surgical History  Procedure Laterality Date  . Tonsillectomy    . Sinus irrigation    . Wisdom tooth extraction    . Biopsy breast      There were no vitals filed for this visit.  Visit Diagnosis:  Low back pain, unspecified back pain laterality, with sciatica presence unspecified  Muscle weakness (generalized)  Lumbar spine strain, subsequent encounter      Subjective Assessment - 11/21/14 1722    Subjective Patient states that her low back is not bothering her. Her upper back around the bra line feels stiff. Saw a chiropractor yesterday who finally got her upper back to pop. Patient state that her back felt better and less stiff after today's session.    Patient Stated Goals "I just want my pain to go away."   Currently in Pain? No/denies          [facility] Adult PT Treatment/Exercise - 11/21/14 1620    Knee/Hip Exercises: Standing   Other Standing Knee Exercises Directed patient with standing L shoulder extension red band 6x5 seconds, standing R shoulder extension with red band 10x2 with 5 second holds.    Knee/Hip Exercises: Seated   Other Seated Knee Exercises seated anterior and posterior pelvic tilts 10x3 on  physioball, seated L trunk rotation on physioball 10x3   Knee/Hip Exercises: Sidelying   Other Sidelying Knee Exercises R S/L open books 10x3   Knee/Hip Exercises: Prone   Other Prone Exercises Directed patient with quadruped L trunk rotation with static head 10x3, quadruped arch and sags 10x3,   Manual Therapy   Manual therapy comments Prone R to L transverse glide to T4 and T5 transvserse processes grade 3.             PT Education - 11/21/14 1726    Education provided Yes   Education Details Ther-ex, HEP   Person(s) Educated Patient   Methods Explanation;Demonstration;Tactile cues;Verbal cues;Handout   Comprehension Verbalized understanding;Returned demonstration             PT Long Term Goals - 11/12/14 1606    PT LONG TERM GOAL #1   Title Patient will be independent with her home exercise program to improve ability to tolerate prolonged standing, sitting, as well as to improve ability to lift her son    Time 4   Period Weeks   Status On-going   PT LONG TERM GOAL #2   Title Patient will report 3/10 or less back pain at worst to improve ability to tolerate prolonged sitting to study, as well as to improve ability to perform work duties such as lifting patients and retrieving medications from  the medication cart    Time 4   Period Weeks   Status On-going   PT LONG TERM GOAL #3   Title Patient will report being able to sit for at least 1.5 hours or greater to improve ability to study for her classes    Time 4   Period Weeks   Status On-going   PT LONG TERM GOAL #4   Title Patient will improve Modified Oswestry Low Back Pain Disability questionnaire by at least 6 points as a demonstration of improved function   Time 4   Period Weeks   Status On-going               Plan - 11/21/14 1727    Clinical Impression Statement Decreased upper back stiffness especially with standing R trunk rotation after session. Continue skilled PT services to promote mobility in  upper back and continue progress towards decreasing back pain.    Pt will benefit from skilled therapeutic intervention in order to improve on the following deficits Decreased range of motion;Impaired flexibility;Improper body mechanics;Postural dysfunction;Pain;Decreased strength   Rehab Potential Good   PT Frequency 2x / week   PT Duration 4 weeks   PT Treatment/Interventions Traction;ADLs/Self Care Home Management;Neuromuscular re-education;Ultrasound;Passive range of motion;Patient/family education;Functional mobility training;Cryotherapy;Therapeutic activities;Electrical Stimulation;Therapeutic exercise;Manual techniques;Moist Heat   PT Next Visit Plan promote L thoracic and lumbar rotation mobility, thoracic extension, medial patellar mobility (McConnell taping), ergonomic lifting.    Consulted and Agree with Plan of Care Patient        Problem List There are no active problems to display for this patient.   Raiden Yearwood 11/21/2014, 5:31 PM  [place] [facility] [address], [place], [address] Phone: [phone]   Fax:  [phone]

## 2014-11-21 NOTE — Patient Instructions (Addendum)
Patient was recommended to apply hot back to her upper back about 15 min at a time to help decrease stiffness, such as when she is studying.   Also provided drawing of R S/L L trunk rotation 10x3 for R side only for her HEP. Patient verbalized understanding.  Improved exercise technique, movement at target joints, use of target muscles after mod verbal, visual, tactile cues.

## 2014-11-26 ENCOUNTER — Ambulatory Visit: Payer: No Typology Code available for payment source

## 2014-11-28 ENCOUNTER — Ambulatory Visit: Payer: No Typology Code available for payment source | Attending: Pain Medicine

## 2014-11-28 DIAGNOSIS — M6281 Muscle weakness (generalized): Secondary | ICD-10-CM | POA: Insufficient documentation

## 2014-11-28 DIAGNOSIS — M545 Low back pain: Secondary | ICD-10-CM | POA: Diagnosis present

## 2014-11-28 DIAGNOSIS — S39012D Strain of muscle, fascia and tendon of lower back, subsequent encounter: Secondary | ICD-10-CM | POA: Insufficient documentation

## 2014-11-28 DIAGNOSIS — X58XXXA Exposure to other specified factors, initial encounter: Secondary | ICD-10-CM | POA: Diagnosis not present

## 2014-11-28 NOTE — Therapy (Signed)
[place] [facility] [address], [place], [address] Phone: [phone]   Fax:  [phone]  Physical Therapy Treatment  Patient Details  Name: Cynthia Davidson MRN: [id number] Date of Birth: October 19, 1985 Referring Provider:  Birdie Sons, MD  Encounter Date: 11/28/2014      PT End of Session - 11/28/14 1550    Visit Number 7   Number of Visits 12   Date for PT Re-Evaluation 12/10/14   PT Start Time 7673   PT Stop Time 4193   PT Time Calculation (min) 31 min   Activity Tolerance Patient tolerated treatment well;No increased pain   Behavior During Therapy [facility] for tasks assessed/performed      Past Medical History  Diagnosis Date  . Panic attack   . Anxiety attack     Past Surgical History  Procedure Laterality Date  . Tonsillectomy    . Sinus irrigation    . Wisdom tooth extraction    . Biopsy breast      There were no vitals filed for this visit.  Visit Diagnosis:  Low back pain, unspecified back pain laterality, with sciatica presence unspecified  Muscle weakness (generalized)  Lumbar spine strain, subsequent encounter      Subjective Assessment - 11/28/14 1551    Subjective Patient states that she got a new mattress this weakend. Back is hurtting currently. 1/10 at the moment. 5/10 this morning.    Patient Stated Goals "I just want my pain to go away."   Currently in Pain? Yes   Pain Score 1    Pain Location Back   Pain Orientation Lower   Multiple Pain Sites No      Objectives:  Manual therapy:  Prone P to A to R sacral ala grade 3 (no back pain afterwards)   There-ex:  Directed patient with seated on physioball: anterior and posterior pelvic tilts, side/side pelvic tilts 10x3 each direction  Planks 15 seconds x 3,   Quadruped L trunk rotation 10x3,   Supine mid to upper thoracic extension against horizontal foam roller 10x,  R S/L L trunk rotation (bows and arrows)  10x3.    Patient had to leave early secondary to needing to pick up her son from day care.        PT Education - 11/29/14 1211    Education provided Yes   Education Details ther-ex   [employer]) Educated Patient   Methods Explanation;Demonstration;Verbal cues;Tactile cues   Comprehension Returned demonstration          PT Long Term Goals - 11/12/14 1606    PT LONG TERM GOAL #1   Title Patient will be independent with her home exercise program to improve ability to tolerate prolonged standing, sitting, as well as to improve ability to lift her son    Time 4   Period Weeks   Status On-going   PT LONG TERM GOAL #2   Title Patient will report 3/10 or less back pain at worst to improve ability to tolerate prolonged sitting to study, as well as to improve ability 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    Time 4   Period Weeks   Status On-going   PT LONG TERM GOAL #3   Title Patient will report being able to sit for at least 1.5 hours or greater to improve ability to study for her classes    Time 4  Period Weeks   Status On-going   PT LONG TERM GOAL #4   Title Patient will improve Modified Oswestry Low Back Pain Disability questionnaire by at least 6 points as a demonstration of improved function   Time 4   Period Weeks   Status On-going           Plan - 11/29/14 1212    Clinical Impression Statement No back pain after manual therapy. Continue with lumbar and thoracic mobility, followed by stability exercises.    Pt will benefit from skilled therapeutic intervention in order to improve on the following deficits Decreased range of motion;Impaired flexibility;Improper body mechanics;Postural dysfunction;Pain;Decreased strength   Rehab Potential Good   PT Frequency 2x / week   PT Duration 4 weeks   PT Treatment/Interventions Traction;ADLs/Self Care Home Management;Neuromuscular re-education;Ultrasound;Passive range  of motion;Patient/family education;Functional mobility training;Cryotherapy;Therapeutic activities;Electrical Stimulation;Therapeutic exercise;Manual techniques;Moist Heat   PT Next Visit Plan promote L thoracic and lumbar rotation mobility, thoracic extension, ergonomic lifting.    Consulted and Agree with Plan of Care Patient        Problem List There are no active problems to display for this patient.   Remus Hagedorn 11/29/2014, 12:15 PM  [place] [facility] [address] [place], [address] Phone: [phone]   Fax:  [phone]

## 2014-11-29 NOTE — Patient Instructions (Signed)
Improved exercise technique, movement at target joints, use of target muscles after min to mod verbal, visual, tactile cues.   

## 2014-12-03 ENCOUNTER — Ambulatory Visit: Payer: No Typology Code available for payment source

## 2014-12-03 DIAGNOSIS — S39012D Strain of muscle, fascia and tendon of lower back, subsequent encounter: Secondary | ICD-10-CM

## 2014-12-03 DIAGNOSIS — M545 Low back pain: Secondary | ICD-10-CM

## 2014-12-03 DIAGNOSIS — M6281 Muscle weakness (generalized): Secondary | ICD-10-CM

## 2014-12-03 NOTE — Therapy (Signed)
[place] [facility] [address], [place], [address] Phone: [phone]   Fax:  [phone]  Physical Therapy Treatment  Patient Details  Name: Cynthia Davidson MRN: [id number] Date of Birth: 1985/12/27 Referring Provider:  Birdie Sons, MD  Encounter Date: 12/03/2014      PT End of Session - 12/03/14 1552    Visit Number 8   Number of Visits 12   Date for PT Re-Evaluation 12/10/14   PT Start Time 9794   PT Stop Time 8016   PT Time Calculation (min) 33 min   Activity Tolerance Patient tolerated treatment well;No increased pain   Behavior During Therapy [facility] for tasks assessed/performed      Past Medical History  Diagnosis Date  . Panic attack   . Anxiety attack     Past Surgical History  Procedure Laterality Date  . Tonsillectomy    . Sinus irrigation    . Wisdom tooth extraction    . Biopsy breast      There were no vitals filed for this visit.  Visit Diagnosis:  Low back pain, unspecified back pain laterality, with sciatica presence unspecified  Muscle weakness (generalized)  Lumbar spine strain, subsequent encounter      Subjective Assessment - 12/03/14 1610    Subjective Patient states not having pain. Just tightess.    Patient Stated Goals "I just want my pain to go away."   Currently in Pain? No/denies         [facility] PT Assessment - 12/03/14 1634    Observation/Other Assessments   Oswestry Disability Index  12% (min disability)         [facility] Adult PT Treatment/Exercise - 12/03/14 1553    Knee/Hip Exercises: Supine   Other Supine Knee Exercises Directed patient with hooklying mid thoracic extension against foam roller (horizontally) 10x2 with 5 second holds in extension. supine bridge with alternate march 10x3 each LE, single leg bridge using L glute max 10x2   Knee/Hip Exercises: Prone   Other Prone Exercises S/L bows and arrows 10x3 with 5 second holds on R side.    Manual  Therapy   Manual therapy comments Prone UPA to R sacral ala grade 3, prone central P to A to lower and mid thoracic spine grade 3.           PT Education - 12/03/14 2029    Education provided Yes   Education Details ther-ex   [employer]) Educated Patient   Methods Explanation;Demonstration;Tactile cues;Verbal cues   Comprehension Returned demonstration             PT Long Term Goals - 12/03/14 1635    PT LONG TERM GOAL #1   Title Patient will be independent with her home exercise program to improve ability to tolerate prolonged standing, sitting, as well as to improve ability to lift her son    Time 4   Period Weeks   Status On-going   PT LONG TERM GOAL #2   Title Patient will report 3/10 or less back pain at worst to improve ability to tolerate prolonged sitting to study, as well as to improve ability 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    Time 4   Period Weeks   Status On-going   PT [place] #3   Title Patient will report being able to sit for at least 1.5 hours or greater to improve ability  to study for her classes    Time 4   Period Weeks   Status On-going   PT LONG TERM GOAL #4   Title Patient will improve Modified Oswestry Low Back Pain Disability questionnaire by at least 6 points as a demonstration of improved function   Time 4   Period Weeks   Status Achieved               Plan - 12/03/14 1640    Clinical Impression Statement Patient tolerated session well without complain of increased pain. (+) long sit test suggesting anterior nutation of L innominate. Demonstrates some L glute max weakness during bridge with march.  Session ended early secondary to pt needing to pick up her son from daycare   Pt will benefit from skilled therapeutic intervention in order to improve on the following deficits Decreased range of motion;Impaired flexibility;Improper body mechanics;Postural  dysfunction;Pain;Decreased strength   Rehab Potential Good   PT Frequency 2x / week   PT Duration 4 weeks   PT Treatment/Interventions Traction;ADLs/Self Care Home Management;Neuromuscular re-education;Ultrasound;Passive range of motion;Patient/family education;Functional mobility training;Cryotherapy;Therapeutic activities;Electrical Stimulation;Therapeutic exercise;Manual techniques;Moist Heat   PT Next Visit Plan promote L thoracic and lumbar rotation mobility, thoracic extension, ergonomic lifting.    Consulted and Agree with Plan of Care Patient        Problem List There are no active problems to display for this patient.   [name] 12/03/2014, 8:35 PM  [place] [facility] [address] [place], [address] Phone: [phone]   Fax:  [phone]

## 2014-12-03 NOTE — Patient Instructions (Signed)
Improved exercise technique, movement at target joints, use of target muscles after min verbal, visual, tactile cues.   

## 2014-12-05 ENCOUNTER — Ambulatory Visit: Payer: No Typology Code available for payment source

## 2014-12-05 DIAGNOSIS — M545 Low back pain: Secondary | ICD-10-CM

## 2014-12-05 DIAGNOSIS — S39012D Strain of muscle, fascia and tendon of lower back, subsequent encounter: Secondary | ICD-10-CM

## 2014-12-05 DIAGNOSIS — M6281 Muscle weakness (generalized): Secondary | ICD-10-CM

## 2014-12-05 NOTE — Therapy (Signed)
[place] [facility] [address], [place], [address] Phone: [phone]   Fax:  [phone]  Physical Therapy Treatment  Patient Details  Name: Cynthia Davidson MRN: [id number] Date of Birth: 1986/04/11 Referring Provider:  Mohammed Kindle, MD  Encounter Date: 12/05/2014      PT End of Session - 12/05/14 1536    Visit Number 9   Number of Visits 12   Date for PT Re-Evaluation 12/10/14   PT Start Time 9233   PT Stop Time 0076   PT Time Calculation (min) 52 min   Activity Tolerance Patient tolerated treatment well;No increased pain   Behavior During Therapy [facility] for tasks assessed/performed      Past Medical History  Diagnosis Date  . Panic attack   . Anxiety attack     Past Surgical History  Procedure Laterality Date  . Tonsillectomy    . Sinus irrigation    . Wisdom tooth extraction    . Biopsy breast      There were no vitals filed for this visit.  Visit Diagnosis:  Low back pain, unspecified back pain laterality, with sciatica presence unspecified  Muscle weakness (generalized)  Lumbar spine strain, subsequent encounter      Subjective Assessment - 12/05/14 1536    Subjective Patient states that her back is not bothering her today or yesterday.          [place] Adult PT Treatment/Exercise - 12/05/14 1542    Knee/Hip Exercises: Standing   Other Standing Knee Exercises standing bilateral shoulder extension resisting green band with opposite tip toe assist 10x3 with 5 second holds. Reviewed HEP (see pt instructions).    Knee/Hip Exercises: Seated   Other Seated Knee Exercises seated anterior and posterior pelvic tilts 10x3 on physioball, seated side to side pelvic rocks on physioball 10x3 each direction.    Knee/Hip Exercises: Supine   Other Supine Knee Exercises Directed patient with hooklying mid thoracic extension against foam roller (horizontally) 10x3 with 5 second holds in extension.  supine bridge with alternate march 10x3 each LE, single leg bridge using L glute max 10x3   Knee/Hip Exercises: Prone   Other Prone Exercises prone glute max extension 10x3 with 5 second holds each LE.             PT Education - 12/05/14 1632    Education provided Yes   Education Details ther-ex, HEP   Person(s) Educated Patient   Methods Explanation;Demonstration;Tactile cues;Verbal cues;Handout   Comprehension Verbalized understanding;Returned demonstration             PT Long Term Goals - 12/03/14 1635    PT LONG TERM GOAL #1   Title Patient will be independent with her home exercise program to improve ability to tolerate prolonged standing, sitting, as well as to improve ability to lift her son    Time 4   Period Weeks   Status On-going   PT LONG TERM GOAL #2   Title Patient will report 3/10 or less back pain at worst to improve ability to tolerate prolonged sitting to study, as well as to improve ability 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    Time 4   Period Weeks   Status On-going   PT LONG TERM GOAL #3   Title Patient will report being able to sit for at least 1.5 hours or greater to improve ability to study for her classes  Time 4   Period Weeks   Status On-going   PT LONG TERM GOAL #4   Title Patient will improve Modified Oswestry Low Back Pain Disability questionnaire by at least 6 points as a demonstration of improved function   Time 4   Period Weeks   Status Achieved               Plan - 12/05/14 1607    Clinical Impression Statement Demonstrates equal leg length in supine suggesting good lumbopelvic positionning. Tolerated session without aggravation of back symptoms.   Pt will benefit from skilled therapeutic intervention in order to improve on the following deficits Decreased range of motion;Impaired flexibility;Improper body mechanics;Postural dysfunction;Pain;Decreased strength    Rehab Potential Good   PT Frequency 2x / week   PT Duration 4 weeks   PT Treatment/Interventions Traction;ADLs/Self Care Home Management;Neuromuscular re-education;Ultrasound;Passive range of motion;Patient/family education;Functional mobility training;Cryotherapy;Therapeutic activities;Electrical Stimulation;Therapeutic exercise;Manual techniques;Moist Heat   PT Next Visit Plan Try ergonomic lifting.    Consulted and Agree with Plan of Care Patient        Problem List There are no active problems to display for this patient.   Logen Heintzelman 12/05/2014, 4:36 PM  [place] [facility] [address], [place], [address] Phone: [phone]   Fax:  [phone]

## 2014-12-05 NOTE — Patient Instructions (Signed)
Gave supine L single leg bridge, standing bilateral shoulder extension resisting green band with SLS and opposite tip toe assiste (10x3 with 5 seconds each LE), and prone glute max extension as part of her HEP. Patient demonstrated and verbalized understanding.   Improved exercise technique, movement at target joints, use of target muscles after min to mod verbal, visual, tactile cues.

## 2014-12-07 ENCOUNTER — Emergency Department
Admission: EM | Admit: 2014-12-07 | Discharge: 2014-12-08 | Disposition: A | Discharge status: Home / Self Care | Payer: No Typology Code available for payment source | Attending: Student | Admitting: Student

## 2014-12-07 DIAGNOSIS — R519 Headache, unspecified: Secondary | ICD-10-CM

## 2014-12-07 DIAGNOSIS — R51 Headache: Secondary | ICD-10-CM | POA: Diagnosis present

## 2014-12-07 DIAGNOSIS — Z3202 Encounter for pregnancy test, result negative: Secondary | ICD-10-CM | POA: Insufficient documentation

## 2014-12-07 DIAGNOSIS — Z79899 Other long term (current) drug therapy: Secondary | ICD-10-CM | POA: Insufficient documentation

## 2014-12-07 DIAGNOSIS — Z720 Tobacco use: Secondary | ICD-10-CM | POA: Diagnosis not present

## 2014-12-07 MED ORDER — KETOROLAC TROMETHAMINE 30 MG/ML IJ SOLN
30.0000 mg | Freq: Once | INTRAMUSCULAR | Status: AC
  Administered 2014-12-08: 30 mg via INTRAVENOUS

## 2014-12-07 MED ORDER — METOCLOPRAMIDE HCL 5 MG/ML IJ SOLN
10.0000 mg | Freq: Once | INTRAMUSCULAR | Status: AC
  Administered 2014-12-08: 10 mg via INTRAVENOUS

## 2014-12-07 MED ORDER — METOCLOPRAMIDE HCL 5 MG/ML IJ SOLN
INTRAMUSCULAR | Status: AC
  Administered 2014-12-08: 10 mg via INTRAVENOUS
  Filled 2014-12-07: qty 2

## 2014-12-07 MED ORDER — DIPHENHYDRAMINE HCL 50 MG/ML IJ SOLN
INTRAMUSCULAR | Status: AC
  Administered 2014-12-08: 12.5 mg via INTRAVENOUS
  Filled 2014-12-07: qty 1

## 2014-12-07 MED ORDER — SODIUM CHLORIDE 0.9 % IV BOLUS (SEPSIS)
1000.0000 mL | Freq: Once | INTRAVENOUS | Status: AC
  Administered 2014-12-08: 1000 mL via INTRAVENOUS

## 2014-12-07 MED ORDER — DIPHENHYDRAMINE HCL 50 MG/ML IJ SOLN
12.5000 mg | Freq: Once | INTRAMUSCULAR | Status: AC
  Administered 2014-12-08: 12.5 mg via INTRAVENOUS

## 2014-12-07 MED ORDER — KETOROLAC TROMETHAMINE 30 MG/ML IJ SOLN
INTRAMUSCULAR | Status: AC
  Administered 2014-12-08: 30 mg via INTRAVENOUS
  Filled 2014-12-07: qty 1

## 2014-12-07 NOTE — ED Notes (Signed)
Pt reports on and off headache " for some time". Pt reports worsening headache today. Pt is in no cute distress, AO X 3

## 2014-12-07 NOTE — ED Notes (Signed)
Pt c/o headache, dizziness, nausea and twitching to left eye intermittently x 1 week. Denies injury. Denies hx of migraines.

## 2014-12-08 NOTE — ED Provider Notes (Signed)
[facility] Emergency Department Provider Note  ____________________________________________  Time seen: Approximately 12:44 AM  I have reviewed the triage vital signs and the nursing notes.   HISTORY  Chief Complaint Headache    HPI Cynthia Davidson is a 29 y.o. female with history of anxiety and panic disorders who presents for evaluation of gradual onset headache today which has been constant. Headache was not maximal at onset. It is described as throbbing. It is associated with some nausea, no photophobia, no neck stiffness, no fevers. She has had headaches on and off "for a long time". She denies any history of migraines. Current severity is 8 out of 10. No modifying factors. No recent illness include no cough, sneezing, runny nose, congestion, no numbness or weakness in the arms or legs.   Past Medical History  Diagnosis Date  . Panic attack   . Anxiety attack     There are no active problems to display for this patient.   Past Surgical History  Procedure Laterality Date  . Tonsillectomy    . Sinus irrigation    . Wisdom tooth extraction    . Biopsy breast      Current Outpatient Rx  Name  Route  Sig  Dispense  Refill  . amphetamine-dextroamphetamine (ADDERALL) 20 MG tablet   Oral   Take 20 mg by mouth 2 (two) times daily as needed.         . clonazePAM (KLONOPIN) 0.5 MG tablet   Oral   Take 0.5 mg by mouth 2 (two) times daily as needed for anxiety.         Marland Kitchen amphetamine-dextroamphetamine (ADDERALL) 10 MG tablet   Oral   Take 10 mg by mouth 2 (two) times daily.         . hydrocortisone cream 1 %      Apply to affected area 2 times daily Patient not taking: Reported on 12/08/2014   15 g   0   . ibuprofen (ADVIL,MOTRIN) 600 MG tablet   Oral   Take 1 tablet (600 mg total) by mouth every 6 (six) hours as needed for pain. Patient not taking: Reported on 12/08/2014   30 tablet   0     Allergies Doxycycline  No family  history on file.  Social History History  Substance Use Topics  . Smoking status: Current Every Day Smoker  . Smokeless tobacco: Not on file  . Alcohol Use: Yes     Comment: occasional    Review of Systems  *Constitutional: No fever/chills Eyes: No visual changes. ENT: No sore throat. Cardiovascular: Denies chest pain. Respiratory: Denies shortness of breath. Gastrointestinal: No abdominal pain.  No nausea, no vomiting.  No diarrhea.  No constipation. Genitourinary: Negative for dysuria. Musculoskeletal: Negative for back pain. Skin: Negative for rash. Neurological: Positive for headache, no focal weakness or numbness.  10-point ROS otherwise negative.  ____________________________________________   PHYSICAL EXAM:  VITAL SIGNS: ED Triage Vitals  Enc Vitals Group     BP 12/07/14 2142 113/78 mmHg     Pulse Rate 12/07/14 2142 90     Resp 12/07/14 2142 18     Temp 12/07/14 2142 98.7 F (37.1 C)     Temp Source 12/07/14 2142 Oral     SpO2 12/07/14 2142 100 %     Weight 12/07/14 2142 155 lb (70.308 kg)     Height 12/07/14 2142 5\' 5"  (1.651 m)     Head Cir --  Peak Flow --      Pain Score 12/07/14 2144 7     Pain Loc --      Pain Edu? --      Excl. in [place]? --     Constitutional: Alert and oriented. Well appearing and in no acute distress. Sitting up in a hallway bed talking to her friend at bedside. Eyes: Conjunctivae are normal. PERRL. EOMI. Head: Atraumatic. Nose: No congestion/rhinnorhea. Mouth/Throat: Mucous membranes are moist.  Oropharynx non-erythematous. Neck: No stridor.  Supple without meningismus. Cardiovascular: Normal rate, regular rhythm. Grossly normal heart sounds.  Good peripheral circulation. Respiratory: Normal respiratory effort.  No retractions. Lungs CTAB. Gastrointestinal: Soft and nontender. No distention. No abdominal bruits. No CVA tenderness. Genitourinary: deferred Musculoskeletal: No lower extremity tenderness nor edema.  No joint  effusions. Neurologic:  Normal speech and language. No gross focal neurologic deficits are appreciated. Speech is normal. No gait instability. 5 out of 5 strength in bilateral upper and lower extremities, sensation intact to light touch throughout. Skin:  Skin is warm, dry and intact. No rash noted. Psychiatric: Mood and affect are normal. Speech and behavior are normal.  ____________________________________________   LABS (all labs ordered are listed, but only abnormal results are displayed)  Labs Reviewed  POC URINE PREG, ED  Upreg negative. ____________________________________________  EKG  none ____________________________________________  RADIOLOGY  none ____________________________________________   PROCEDURES  Procedure(s) performed: None  Critical Care performed: No  ____________________________________________   INITIAL IMPRESSION / ASSESSMENT AND PLAN / ED COURSE  Pertinent labs & imaging results that were available during my care of the patient were reviewed by me and considered in my medical decision making (see chart for details).  Cynthia Davidson is a 29 y.o. female with history of anxiety and panic disorders who presents for evaluation of gradual onset headache today which has been constant. On exam, she is very well-appearing and in no acute distress. Sitting up in bed, talkative with friend at bedside. Neck is supple without meningismus, vital signs stable, she is afebrile. Headache not consistent with subarachnoid hemorrhage or meningitis. Intact neurological exam. We'll treat with migraine cocktail and anticipate discharge home with PCP follow-up.  ----------------------------------------- 2:46 AM on 12/08/2014 -----------------------------------------  Patient reports significant improvement in her headache with migraine cocktail. DC with return precautions and PCP follow-up. She is comfortable with  discharge. ____________________________________________   FINAL CLINICAL IMPRESSION(S) / ED DIAGNOSES  Final diagnoses:  Acute nonintractable headache, unspecified headache type      Joanne Gavel, MD 12/08/14 0246

## 2014-12-08 NOTE — ED Notes (Signed)
Pt upon discharged voiced her dissatisfaction. Pt stated " y'alll didn't do anything, you did not give me any meds to go home with if my headache comes back."

## 2014-12-08 NOTE — ED Notes (Signed)
Patient in bed with eyes closed. Even respirations. Friend at bedside, no distress noted.

## 2014-12-10 ENCOUNTER — Ambulatory Visit: Payer: No Typology Code available for payment source | Admitting: Nurse Practitioner

## 2014-12-10 ENCOUNTER — Ambulatory Visit: Payer: No Typology Code available for payment source

## 2014-12-10 DIAGNOSIS — M6281 Muscle weakness (generalized): Secondary | ICD-10-CM

## 2014-12-10 DIAGNOSIS — M545 Low back pain: Secondary | ICD-10-CM | POA: Diagnosis not present

## 2014-12-10 NOTE — Patient Instructions (Signed)
Improved exercise technique, movement at target joints, use of target muscles after mod verbal, visual, tactile cues.   

## 2014-12-11 LAB — POCT PREGNANCY, URINE: Preg Test, Ur: NEGATIVE

## 2014-12-11 NOTE — Therapy (Signed)
[facility] [address], [place], [address] Phone: [phone]   Fax:  [phone]  December 11, 2014   @[email or web address] @  Physical Therapy Discharge Summary  Patient: Cynthia Davidson  MRN: [id number]  Date of Birth: 11-Aug-1985   Diagnosis: Low back pain, unspecified back pain laterality, with sciatica presence unspecified  Muscle weakness (generalized) Referring Provider:  Birdie Sons, MD  The above patient had been seen in Physical Therapy 10 times. The treatment consisted of Therapeutic exercise, manual techniques, therapeutic activities The patient is: improved   Visit Diagnosis:  Low back pain, unspecified back pain laterality, with sciatica presence unspecified  Muscle weakness (generalized)      Subjective Assessment - 12/10/14 1538    Subjective No back pain currently. 3/10 after work today. Back has been ok with working with the medication cart until today. 4/10 at worst for the past 3 days. Upper back feels stiff after waking up in the morning.    Patient Stated Goals "I just want my pain to go away."   Currently in Pain? No/denies            [facility] Adult PT Treatment/Exercise - 12/10/14 1536    Knee/Hip Exercises: Standing   Other Standing Knee Exercises Standing ergonomic lifting, ergonomic transfers. Standing on dyna disc ball toss using 2 kg ball. Reviewed progress with LE strength with pt.    Knee/Hip Exercises: Supine   Other Supine Knee Exercises hooklying mid thoracic extension over bolster 10x5 seconds, supine bridge with vertical towel roll on L mid thoracic spine 10x3 with 5 second holds, S/L bows and arrows 10x5 seconds each side.    Other Supine Knee Exercises manually resisted S/L hip abduction, glute max extension 1-2x each way for each LE.    Manual Therapy   Manual therapy comments Prone UPA to L T7 TP grade 3 to 3+           PT Education - 12/11/14 1922    Education provided Yes   Education Details ther-ex   [employer]) Educated Patient   Methods Explanation;Demonstration;Verbal cues   Comprehension Returned demonstration;Verbalized understanding           PT Long Term Goals - 12/10/14 1708    PT LONG TERM GOAL #1   Title Patient will be independent with her home exercise program to improve ability to tolerate prolonged standing, sitting, as well as to improve ability to lift her son    Time 4   Period Weeks   Status Achieved   PT LONG TERM GOAL #2   Title Patient will report 3/10 or less back pain at worst to improve ability to tolerate prolonged sitting to study, as well as to improve ability 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    PT LONG TERM GOAL #4   Title Patient will improve Modified Oswestry Low Back Pain Disability questionnaire by at least 6 points as a demonstration of improved function   Time 4  Period Weeks   Status Achieved           Plan - 12/10/14 1550    Clinical Impression Statement Strength: hip aduction 5/5 bilaterally, glute max extension R 5/5, L 4+/5. Patient has demonstrated improved bilateral glute med and max strength, decreased pain at worst from 7/10 to 4/10, improved Modified Oswestry Low Back Pain disability questionnaire suggesing improved function since intitial evaluation. Patient has made good progress towards goals. Almost met to long term goal of 3/10 low back pain at worst (4/10 at worst).  Patient also independent with her exercises at home. Skilled physical therapy services discharged with patient continuing progress with her home  exercise program.    Pt will benefit from skilled therapeutic intervention in order to improve on the following deficits --   Rehab Potential Good   PT Treatment/Interventions Traction;ADLs/Self Care Home Management;Neuromuscular re-education;Ultrasound;Passive range of motion;Patient/family education;Functional mobility training;Cryotherapy;Therapeutic activities;Electrical Stimulation;Therapeutic exercise;Manual techniques;Moist Heat   PT Next Visit Plan Skilled physical therapy services discharged with patient continuing progress with her home exercise program.    Consulted and Agree with Plan of Care Patient       Sincerely,   Laygo,Miguel, PT   CC @[email or web address] @  [place] [address], [place], [address] Phone: [phone]   Fax:  [phone]

## 2014-12-11 NOTE — Therapy (Signed)
[facility] [address] [place], [address] Phone: [phone]   Fax:  [phone]  Physical Therapy Treatment  Patient Details  Name: Cynthia Davidson MRN: [id number] Date of Birth: Jun 04, 1986 Referring Provider:  Birdie Sons, MD  Encounter Date: 12/10/2014      PT End of Session - 12/10/14 1541    Visit Number 10   Number of Visits 12   Date for PT Re-Evaluation 12/10/14   PT Start Time 1522   PT Stop Time 1603   PT Time Calculation (min) 41 min   Activity Tolerance Patient tolerated treatment well;No increased pain   Behavior During Therapy [facility] for tasks assessed/performed      Past Medical History  Diagnosis Date  . Panic attack   . Anxiety attack     Past Surgical History  Procedure Laterality Date  . Tonsillectomy    . Sinus irrigation    . Wisdom tooth extraction    . Biopsy breast      There were no vitals filed for this visit.  Visit Diagnosis:  Low back pain, unspecified back pain laterality, with sciatica presence unspecified  Muscle weakness (generalized)      Subjective Assessment - 12/10/14 1538    Subjective No back pain currently. 3/10 after work today. Back has been ok with working with the medication cart until today. 4/10 at worst for the past 3 days. Upper back feels stiff after waking up in the morning.    Patient Stated Goals "I just want my pain to go away."   Currently in Pain? No/denies            [facility] Adult PT Treatment/Exercise - 12/10/14 1536    Knee/Hip Exercises: Standing   Other Standing Knee Exercises Standing ergonomic lifting, ergonomic transfers. Standing on dyna disc ball toss using 2 kg ball. Reviewed progress with LE strength with pt.    Knee/Hip Exercises: Supine   Other Supine Knee Exercises hooklying mid thoracic extension over bolster 10x5 seconds, supine bridge with vertical towel roll on L mid thoracic spine 10x3 with 5 second  holds, S/L bows and arrows 10x5 seconds each side.    Other Supine Knee Exercises manually resisted S/L hip abduction, glute max extension 1-2x each way for each LE.    Manual Therapy   Manual therapy comments Prone UPA to L T7 TP grade 3 to 3+           PT Education - 12/11/14 1922    Education provided Yes   Education Details ther-ex   [employer]) Educated Patient   Methods Explanation;Demonstration;Verbal cues   Comprehension Returned demonstration;Verbalized understanding           PT Long Term Goals - 12/10/14 1708    PT LONG TERM GOAL #1   Title Patient will be independent with her home exercise program to improve ability to tolerate prolonged standing, sitting, as well as to improve ability to lift her son    Time 4   Period Weeks   Status Achieved   PT LONG TERM GOAL #2   Title Patient will report 3/10 or less back pain at worst to improve ability to tolerate prolonged sitting to study, as well as to improve ability 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    PT LONG TERM GOAL #4   Title Patient will improve Modified Oswestry Low Back Pain Disability questionnaire by at least 6 points as a demonstration of improved function   Time 4   Period Weeks   Status Achieved           Plan - 12/10/14 1550    Clinical Impression Statement Strength: hip aduction 5/5 bilaterally, glute max extension R 5/5, L 4+/5. Patient has demonstrated improved bilateral glute med and max strength, decreased pain at worst from 7/10 to 4/10, improved Modified Oswestry Low Back Pain disability questionnaire suggesing improved function  since intitial evaluation. Patient has made good progress towards goals. Almost met to long term goal of 3/10 low back pain at worst (4/10 at worst).  Patient also independent with her exercises at home. Skilled physical therapy services discharged with patient continuing progress with her home exercise program.    Pt will benefit from skilled therapeutic intervention in order to improve on the following deficits --   Rehab Potential Good   PT Treatment/Interventions Traction;ADLs/Self Care Home Management;Neuromuscular re-education;Ultrasound;Passive range of motion;Patient/family education;Functional mobility training;Cryotherapy;Therapeutic activities;Electrical Stimulation;Therapeutic exercise;Manual techniques;Moist Heat   PT Next Visit Plan Skilled physical therapy services discharged with patient continuing progress with her home exercise program.    Consulted and Agree with Plan of Care Patient        Problem List There are no active problems to display for this patient.   Rechelle Niebla 12/11/2014, 7:27 PM  [facility] [address], [place], [address] Phone: [phone]   Fax:  [phone]

## 2015-03-13 DIAGNOSIS — G479 Sleep disorder, unspecified: Secondary | ICD-10-CM | POA: Insufficient documentation

## 2015-03-13 DIAGNOSIS — R5383 Other fatigue: Secondary | ICD-10-CM | POA: Insufficient documentation

## 2015-03-17 ENCOUNTER — Other Ambulatory Visit: Payer: Self-pay | Admitting: Neurology

## 2015-03-17 DIAGNOSIS — R5382 Chronic fatigue, unspecified: Secondary | ICD-10-CM

## 2015-03-17 DIAGNOSIS — R51 Headache: Principal | ICD-10-CM

## 2015-03-17 DIAGNOSIS — R519 Headache, unspecified: Secondary | ICD-10-CM

## 2015-03-27 ENCOUNTER — Ambulatory Visit: Admission: RE | Admit: 2015-03-27 | Payer: No Typology Code available for payment source | Source: Ambulatory Visit

## 2015-04-04 ENCOUNTER — Ambulatory Visit: Payer: No Typology Code available for payment source | Attending: Neurology

## 2015-04-04 DIAGNOSIS — G4711 Idiopathic hypersomnia with long sleep time: Secondary | ICD-10-CM | POA: Diagnosis present

## 2015-04-09 ENCOUNTER — Ambulatory Visit
Admission: RE | Admit: 2015-04-09 | Discharge: 2015-04-09 | Disposition: A | Discharge status: Home / Self Care | Payer: No Typology Code available for payment source | Source: Ambulatory Visit | Attending: Neurology | Admitting: Neurology

## 2015-04-09 DIAGNOSIS — R5383 Other fatigue: Secondary | ICD-10-CM | POA: Insufficient documentation

## 2015-04-09 DIAGNOSIS — R519 Headache, unspecified: Secondary | ICD-10-CM

## 2015-04-09 DIAGNOSIS — R51 Headache: Secondary | ICD-10-CM | POA: Diagnosis present

## 2015-04-09 DIAGNOSIS — R5382 Chronic fatigue, unspecified: Secondary | ICD-10-CM

## 2015-04-09 MED ORDER — GADOBENATE DIMEGLUMINE 529 MG/ML IV SOLN
15.0000 mL | Freq: Once | INTRAVENOUS | Status: AC | PRN
  Administered 2015-04-09: 15 mL via INTRAVENOUS

## 2015-04-24 DIAGNOSIS — G44229 Chronic tension-type headache, not intractable: Secondary | ICD-10-CM | POA: Insufficient documentation

## 2015-04-24 DIAGNOSIS — G4711 Idiopathic hypersomnia with long sleep time: Secondary | ICD-10-CM | POA: Insufficient documentation

## 2016-05-17 ENCOUNTER — Ambulatory Visit: Payer: Self-pay | Admitting: Physician Assistant

## 2016-05-17 ENCOUNTER — Encounter: Payer: Self-pay | Admitting: Physician Assistant

## 2016-05-17 VITALS — BP 138/80 | HR 100 | Temp 98.4°F

## 2016-05-17 DIAGNOSIS — J029 Acute pharyngitis, unspecified: Secondary | ICD-10-CM

## 2016-05-17 LAB — POCT RAPID STREP A (OFFICE): Rapid Strep A Screen: NEGATIVE

## 2016-05-17 MED ORDER — CEFDINIR 300 MG PO CAPS: 300.0000 mg | ORAL_CAPSULE | Freq: Two times a day (BID) | ORAL | Status: DC

## 2016-05-17 MED ORDER — CEFDINIR 300 MG PO CAPS: 300.0000 mg | ORAL_CAPSULE | Freq: Two times a day (BID) | ORAL | 0 refills | Status: DC

## 2016-05-17 NOTE — Progress Notes (Signed)
S: C/o sore throat and cough for 3 days, no fever, chills, cp/sob, v/d; throat feels like razer blades when she swallows,  cough is sporadic, hx of sinus surgery, hasn't had a sinus infection in a long time  Using otc meds:   O: PE: perrl eomi, normocephalic, tms dull, nasal mucosa red and swollen, throat injected, neck supple no lymph, lungs c t a, cv rrr, neuro intact, q strep neg  A:  Acute pharyngitis  P: drink fluids, continue regular meds , use otc meds of choice, return if not improving in 5 days, return earlier if worsening, omnicef

## 2016-05-26 ENCOUNTER — Encounter: Payer: Self-pay | Admitting: Physician Assistant

## 2016-05-26 ENCOUNTER — Ambulatory Visit: Payer: Self-pay | Admitting: Physician Assistant

## 2016-05-26 VITALS — BP 104/70 | HR 80 | Temp 98.3°F

## 2016-05-26 DIAGNOSIS — R000 Tachycardia, unspecified: Secondary | ICD-10-CM

## 2016-05-26 DIAGNOSIS — R52 Pain, unspecified: Secondary | ICD-10-CM

## 2016-05-26 LAB — POCT INFLUENZA A/B
INFLUENZA B, POC: NEGATIVE
Influenza A, POC: NEGATIVE

## 2016-05-26 NOTE — Progress Notes (Signed)
S: C/o runny nose and congestion with dry cough for over a week, ? fever, chills, states she keeps feeling cold; denies cp/sob, v/d; mucus was green this am but clear throughout the day, cough is sporadic, was at the ENT with her son and he told her to tell her doctor she needed septra or levaquin since the omnicef didn't work, took Licensed conveyancer last night and her throat felt tight, also was given Human resources officer d  Using otc meds: robitussin  O: PE: vitals wnl, nad,  perrl eomi, normocephalic, tms dull, nasal mucosa red and swollen, throat injected, neck supple no lymph, lungs c t a, cv rrr, neuro intact, flu swab  Neg, ekg sinus tachy  A:  Acute flu like illness   P: drink fluids, continue regular meds , use otc meds of choice, return if not improving in 5 days, return earlier if worsening, stop allegra-d, stop septra as pt states her throat felt tight after taking it, illness appears to be viral, pt to use saline nasal wash, f/u with pcp this week, copy of ekg sent with patient, pt is stable and in nad on discharge

## 2016-07-23 DIAGNOSIS — F909 Attention-deficit hyperactivity disorder, unspecified type: Secondary | ICD-10-CM | POA: Diagnosis not present

## 2016-07-23 DIAGNOSIS — Z1589 Genetic susceptibility to other disease: Secondary | ICD-10-CM | POA: Diagnosis not present

## 2016-07-23 DIAGNOSIS — F341 Dysthymic disorder: Secondary | ICD-10-CM | POA: Diagnosis not present

## 2016-07-23 DIAGNOSIS — M545 Low back pain: Secondary | ICD-10-CM | POA: Diagnosis not present

## 2016-07-23 DIAGNOSIS — J329 Chronic sinusitis, unspecified: Secondary | ICD-10-CM | POA: Diagnosis not present

## 2016-07-23 DIAGNOSIS — H68109 Unspecified obstruction of Eustachian tube, unspecified ear: Secondary | ICD-10-CM | POA: Diagnosis not present

## 2016-07-23 DIAGNOSIS — L130 Dermatitis herpetiformis: Secondary | ICD-10-CM | POA: Diagnosis not present

## 2016-07-29 DIAGNOSIS — D6851 Activated protein C resistance: Secondary | ICD-10-CM

## 2016-07-29 HISTORY — DX: Activated protein C resistance: D68.51

## 2016-08-20 ENCOUNTER — Inpatient Hospital Stay: Payer: 59 | Attending: Hematology and Oncology | Admitting: Hematology and Oncology

## 2016-08-20 ENCOUNTER — Encounter (INDEPENDENT_AMBULATORY_CARE_PROVIDER_SITE_OTHER): Payer: Self-pay

## 2016-08-20 ENCOUNTER — Inpatient Hospital Stay: Payer: 59 | Attending: Hematology and Oncology

## 2016-08-20 ENCOUNTER — Encounter: Payer: Self-pay | Admitting: Hematology and Oncology

## 2016-08-20 VITALS — BP 112/76 | HR 82 | Temp 98.7°F | Resp 18 | Ht 64.5 in | Wt 172.5 lb

## 2016-08-20 DIAGNOSIS — R590 Localized enlarged lymph nodes: Secondary | ICD-10-CM

## 2016-08-20 DIAGNOSIS — R5383 Other fatigue: Secondary | ICD-10-CM

## 2016-08-20 DIAGNOSIS — D6851 Activated protein C resistance: Secondary | ICD-10-CM

## 2016-08-20 DIAGNOSIS — R161 Splenomegaly, not elsewhere classified: Secondary | ICD-10-CM | POA: Insufficient documentation

## 2016-08-20 DIAGNOSIS — Z79899 Other long term (current) drug therapy: Secondary | ICD-10-CM | POA: Diagnosis not present

## 2016-08-20 LAB — COMPREHENSIVE METABOLIC PANEL
ALT: 22 U/L (ref 14–54)
AST: 26 U/L (ref 15–41)
Albumin: 4.8 g/dL (ref 3.5–5.0)
Alkaline Phosphatase: 52 U/L (ref 38–126)
Anion gap: 8 (ref 5–15)
BUN: 13 mg/dL (ref 6–20)
CO2: 25 mmol/L (ref 22–32)
Calcium: 9.2 mg/dL (ref 8.9–10.3)
Chloride: 104 mmol/L (ref 101–111)
Creatinine, Ser: 0.63 mg/dL (ref 0.44–1.00)
GFR calc Af Amer: >60 mL/min (ref >60)
GFR calc non Af Amer: >60 mL/min (ref >60)
Glucose, Bld: 98 mg/dL (ref 65–99)
Potassium: 4.2 mmol/L (ref 3.5–5.1)
Sodium: 137 mmol/L (ref 135–145)
Total Bilirubin: 0.8 mg/dL (ref 0.3–1.2)
Total Protein: 7.9 g/dL (ref 6.5–8.1)

## 2016-08-20 LAB — CBC WITH DIFFERENTIAL/PLATELET
Basophils Absolute: 0 10*3/uL (ref 0–0.1)
Basophils Relative: 1 %
Eosinophils Absolute: 0.1 10*3/uL (ref 0–0.7)
Eosinophils Relative: 1 %
HCT: 40.7 % (ref 35.0–47.0)
Hemoglobin: 14.1 g/dL (ref 12.0–16.0)
Lymphocytes Relative: 31 %
Lymphs Abs: 2 10*3/uL (ref 1.0–3.6)
MCH: 31.1 pg (ref 26.0–34.0)
MCHC: 34.7 g/dL (ref 32.0–36.0)
MCV: 89.7 fL (ref 80.0–100.0)
Monocytes Absolute: 0.4 10*3/uL (ref 0.2–0.9)
Monocytes Relative: 7 %
Neutro Abs: 3.8 10*3/uL (ref 1.4–6.5)
Neutrophils Relative %: 60 %
Platelets: 264 10*3/uL (ref 150–440)
RBC: 4.54 MIL/uL (ref 3.80–5.20)
RDW: 12.4 % (ref 11.5–14.5)
WBC: 6.3 10*3/uL (ref 3.6–11.0)

## 2016-08-20 NOTE — Progress Notes (Signed)
[facility] day:  08/20/2016  Chief Complaint: Cynthia Davidson is a 31 y.o. female with factor V Leiden who is referred by Dr. Elijio Miles for assessment and management.  HPI:  The patient denies any history of thrombosis. She states that her labs were recently drawn secondary to a family history.   A maternal aunt and a uncle had a CVA, both in their 27s.  Both of relatives have factor V Leiden and prothrombin gene mutation.  After these events, family members were encouraged to be tested.  The patient also has a great uncle who had a myocardial infarction at an unknown age. Her paternal grandfather had a aortic aneurysm and possibly a clot.  Her maternal grandfather had a CVA.  One maternal cousin has "1 mutation" and another has "2 mutations".   Neither has had a clot or CVA.   Labs on 08/06/2015 revealed a hematocrit of 39.5, hemoglobin 13.2, platelets 186,000, white count 4600. Anti-thrombin III antigen was 29 (19-30) with antithrombin III activity of 103%. Factor V Leiden revealed one copy (heterozygote) of the R506Q mutation. Prothrombin gene mutation was not detected.  The patient has had an uneventful pregnancy land delivery in 2013. She had a C-section. She has no further plans for other children.  She is not on birth control pills.  She notes a hstory of neck swelling. She has a chronic temperature of 99. She has a chronic post nasal drip. Four months ago at [facility], she had an abdominal ultrasound secondary to intermittent left upper quadrant pain.  She was told that her spleen was slightly enlarged. She denies any history of recurrent infections except for sinus infections and intermittent cold sore.  She denies any weight loss.   Past Medical History:  Diagnosis Date  . Anxiety attack   . Panic attack     Past Surgical History:  Procedure Laterality Date  . BIOPSY BREAST    . SINUS IRRIGATION    . TONSILLECTOMY     . WISDOM TOOTH EXTRACTION      Family History  Problem Relation Age of Onset  . Stroke Maternal Uncle   . Factor V Leiden deficiency Maternal Uncle   . Stroke Paternal Aunt   . Factor V Leiden deficiency Paternal Aunt   . Stroke Maternal Grandfather   . Melanoma Paternal Grandmother   . Cerebral aneurysm Paternal Grandmother   . Heart disease Paternal Grandfather     Social History:  reports that she has never smoked. She has never used smokeless tobacco. She reports that she drinks alcohol. She reports that she uses drugs, including Cocaine and Marijuana.  She is active and does yoga.  She goes to the gym 3-5 x/week.  She works in [employer] at [employer].  The patient is alone today.  Allergies:  Allergies  Allergen Reactions  . Doxycycline Swelling  . Morphine Nausea Only    Current Medications: Current Outpatient Prescriptions  Medication Sig Dispense Refill  . amphetamine-dextroamphetamine (ADDERALL) 20 MG tablet Take 20 mg by mouth 2 (two) times daily as needed.    . clonazePAM (KLONOPIN) 0.5 MG tablet Take 0.5 mg by mouth 2 (two) times daily as needed for anxiety.    Marland Kitchen ibuprofen (ADVIL,MOTRIN) 600 MG tablet Take 1 tablet (600 mg total) by mouth every 6 (six) hours as needed for pain. 30 tablet 0  . ketoconazole (NIZORAL) 2 % shampoo APPLY TO SCALP 3 X WEEK,LEAVE ON 5 MINUTES,  RINSE WELL.  10  . Vitamin D, Ergocalciferol, (DRISDOL) 50000 units CAPS capsule Take 50,000 Units by mouth every 7 (seven) days.    . hydrocortisone cream 1 % Apply to affected area 2 times daily (Patient not taking: Reported on 05/26/2016) 15 g 0  . RETIN-A 0.025 % cream APPLY A PEA SIZED AMOUNT TO DRY FACE NIGHTLY  3   No current facility-administered medications for this visit.     Review of Systems:  GENERAL:  Always tired.  Active.  Constant 99 degree temperature.  No sweats or weight loss. PERFORMANCE STATUS (ECOG):  0 HEENT:  Post nasal drip.  Sinus infections.  No visual changes, sore  throat, mouth sores or tenderness. Lungs: No shortness of breath or cough.  No hemoptysis. Cardiac:  No chest pain, palpitations, orthopnea, or PND. GI:  No nausea, vomiting, diarrhea, constipation, melena or hematochezia. GU:  No urgency, frequency, dysuria, or hematuria. Musculoskeletal:  No back pain.  No joint pain.  No muscle tenderness. Extremities:  No pain or swelling. Skin:  No rashes or skin changes. Neuro:  No headache, numbness or weakness, balance or coordination issues. Endocrine:  No diabetes, thyroid issues, hot flashes or night sweats. Psych:  Anxiety. ADHD.  No mood changes or depression. Pain:  No focal pain. Review of systems:  All other systems reviewed and found to be negative.  Physical Exam: Blood pressure 112/76, pulse 82, temperature 98.7 F (37.1 C), temperature source Tympanic, resp. rate 18, height 5' 4.5" (1.638 m), weight 172 lb 8.2 oz (78.3 kg), SpO2 99 %. GENERAL:  Well developed, well nourished, woman sitting comfortably in the exam room in no acute distress. MENTAL STATUS:  Alert and oriented to person, place and time. HEAD:  Long dark hair.  Normocephalic, atraumatic, face symmetric, no Cushingoid features. EYES:  Pupils equal round and reactive to light and accomodation.  No conjunctivitis or scleral icterus. ENT:  Oropharynx clear without lesion.  Tongue normal. Mucous membranes moist.  RESPIRATORY:  Clear to auscultation without rales, wheezes or rhonchi. CARDIOVASCULAR:  Regular rate and rhythm without murmur, rub or gallop. ABDOMEN:  Soft, non-tender, with active bowel sounds, and no hepatosplenomegaly.  No masses. SKIN:  Right arm tattoo.  No rashes, ulcers or lesions. EXTREMITIES: No edema, no skin discoloration or tenderness.  No palpable cords. LYMPH NODES:  Possible 2 cm right node under SCM.  No other palpable cervical, supraclavicular, axillary or inguinal adenopathy  NEUROLOGICAL: Unremarkable. PSYCH:  Appropriate.   Appointment on  08/20/2016  Component Date Value Ref Range Status  . WBC 08/20/2016 6.3  3.6 - 11.0 K/uL Final  . RBC 08/20/2016 4.54  3.80 - 5.20 MIL/uL Final  . Hemoglobin 08/20/2016 14.1  12.0 - 16.0 g/dL Final  . HCT 08/20/2016 40.7  35.0 - 47.0 % Final  . MCV 08/20/2016 89.7  80.0 - 100.0 fL Final  . MCH 08/20/2016 31.1  26.0 - 34.0 pg Final  . MCHC 08/20/2016 34.7  32.0 - 36.0 g/dL Final  . RDW 08/20/2016 12.4  11.5 - 14.5 % Final  . Platelets 08/20/2016 264  150 - 440 K/uL Final  . Neutrophils Relative % 08/20/2016 60  % Final  . Neutro Abs 08/20/2016 3.8  1.4 - 6.5 K/uL Final  . Lymphocytes Relative 08/20/2016 31  % Final  . Lymphs Abs 08/20/2016 2.0  1.0 - 3.6 K/uL Final  . Monocytes Relative 08/20/2016 7  % Final  . Monocytes Absolute 08/20/2016 0.4  0.2 - 0.9 K/uL Final  .  Eosinophils Relative 08/20/2016 1  % Final  . Eosinophils Absolute 08/20/2016 0.1  0 - 0.7 K/uL Final  . Basophils Relative 08/20/2016 1  % Final  . Basophils Absolute 08/20/2016 0.0  0 - 0.1 K/uL Final  . Sodium 08/20/2016 137  135 - 145 mmol/L Final  . Potassium 08/20/2016 4.2  3.5 - 5.1 mmol/L Final  . Chloride 08/20/2016 104  101 - 111 mmol/L Final  . CO2 08/20/2016 25  22 - 32 mmol/L Final  . Glucose, Bld 08/20/2016 98  65 - 99 mg/dL Final  . BUN 08/20/2016 13  6 - 20 mg/dL Final  . Creatinine, Ser 08/20/2016 0.63  0.44 - 1.00 mg/dL Final  . Calcium 08/20/2016 9.2  8.9 - 10.3 mg/dL Final  . Total Protein 08/20/2016 7.9  6.5 - 8.1 g/dL Final  . Albumin 08/20/2016 4.8  3.5 - 5.0 g/dL Final  . AST 08/20/2016 26  15 - 41 U/L Final  . ALT 08/20/2016 22  14 - 54 U/L Final  . Alkaline Phosphatase 08/20/2016 52  38 - 126 U/L Final  . Total Bilirubin 08/20/2016 0.8  0.3 - 1.2 mg/dL Final  . GFR calc non Af Amer 08/20/2016 >60  >60 mL/min Final  . GFR calc Af Amer 08/20/2016 >60  >60 mL/min Final   Comment: (NOTE) The eGFR has been calculated using the CKD EPI equation. This calculation has not been validated in all  clinical situations. eGFR's persistently <60 mL/min signify possible Chronic Kidney Disease.   Georgiann Hahn gap 08/20/2016 8  5 - 15 Final    Assessment:  KAURI GARSON is a 31 y.o. female with Factor V Leiden (heterozygote for R506Q mutation). Prothrombin gene mutation was not detected.  She has never had a clot.  She had an uneventful pregnancy in 2013.  She is active.  She does not smoke.  She is not on birth control pills.  She has a chronic low grade temperature.  She denies any sweats or weight loss.  She has a recent history of a "midly enlarged spleen".  Exam reveals a possible right sided cervical node.  Plan: 1.  Discuss family history. Discuss current testing.  Discuss recheck of ATIII as antigen level appears low (although in normal range for lab).  No prothrombin mutation.  Discuss heterozygosity for factor V Leiden.  Discuss risk of thrombosis over general population 3-8 x.  Discuss analogy of basket floating on water with rocks in basket being risk factors for thrombosis.  Enough rocks in the basket and it sinks (representing a clot).  Discuss limiting risk factors that she can control.  Discuss activity level, avoiding dehydration, avoiding immobility, avoiding birth control pills (choose another form of contraception), not smoking, and prophylactic anticoagulation for at risk times (pelvic and knee surgery, etc).  2.  Discuss splenomegaly history in conjunction with possible right sided lymph node.  Discuss neck ultrasound and obtaining copy of recent ultrasound.  No splenomegaly on current exam.  3.  Labs today:  CBC with diff, CMP,  ATIII panel. 4.  ROI for abdominal ultrasound. 5.  Neck ultrasound. 6.  RTC in 1 week to review results.   Lequita Asal, MD  08/20/2016

## 2016-08-21 LAB — ANTITHROMBIN PANEL
AT III AG PPP IMM-ACNC: 93 % (ref 72–124)
Antithrombin Activity: 109 % (ref 75–135)

## 2016-08-30 ENCOUNTER — Ambulatory Visit
Admission: RE | Admit: 2016-08-30 | Discharge: 2016-08-30 | Disposition: A | Discharge status: Home / Self Care | Payer: 59 | Source: Ambulatory Visit | Attending: Hematology and Oncology | Admitting: Hematology and Oncology

## 2016-08-30 DIAGNOSIS — D225 Melanocytic nevi of trunk: Secondary | ICD-10-CM | POA: Diagnosis not present

## 2016-08-30 DIAGNOSIS — L309 Dermatitis, unspecified: Secondary | ICD-10-CM | POA: Diagnosis not present

## 2016-08-30 DIAGNOSIS — R590 Localized enlarged lymph nodes: Secondary | ICD-10-CM

## 2016-08-30 DIAGNOSIS — L404 Guttate psoriasis: Secondary | ICD-10-CM | POA: Diagnosis not present

## 2016-08-30 DIAGNOSIS — D485 Neoplasm of uncertain behavior of skin: Secondary | ICD-10-CM | POA: Diagnosis not present

## 2016-08-30 DIAGNOSIS — R591 Generalized enlarged lymph nodes: Secondary | ICD-10-CM | POA: Diagnosis not present

## 2016-08-31 ENCOUNTER — Ambulatory Visit: Payer: Self-pay

## 2016-09-02 ENCOUNTER — Inpatient Hospital Stay: Payer: 59 | Attending: Hematology and Oncology | Admitting: Hematology and Oncology

## 2016-09-02 ENCOUNTER — Encounter: Payer: Self-pay | Admitting: Hematology and Oncology

## 2016-09-02 VITALS — BP 111/68 | HR 88 | Temp 98.6°F | Resp 18 | Wt 174.3 lb

## 2016-09-02 DIAGNOSIS — Z79899 Other long term (current) drug therapy: Secondary | ICD-10-CM | POA: Diagnosis not present

## 2016-09-02 DIAGNOSIS — J069 Acute upper respiratory infection, unspecified: Secondary | ICD-10-CM | POA: Diagnosis not present

## 2016-09-02 DIAGNOSIS — D6851 Activated protein C resistance: Secondary | ICD-10-CM | POA: Insufficient documentation

## 2016-09-02 DIAGNOSIS — R161 Splenomegaly, not elsewhere classified: Secondary | ICD-10-CM | POA: Insufficient documentation

## 2016-09-02 DIAGNOSIS — R590 Localized enlarged lymph nodes: Secondary | ICD-10-CM

## 2016-09-02 NOTE — Progress Notes (Signed)
[facility] day:  09/02/2016  Chief Complaint: DASHA KAWABATA is a 31 y.o. female with factor V Leiden who is seen for review of interval testing.  HPI:  The patient was last seen in the hematology clinic on 08/20/2016.  At that time, she was seen for initial assessment.  She had never had a clot..  We discussed additional testing based on outside labs.  Labs on 08/20/2016 included a normal CBC with diff, CMP, and ATIII panel (antigen 93%, activity 109%).  At last visit, she noted a chronic low grade temperature.  She denied any sweats or weight loss.  She had a recent history of a "midly enlarged spleen".  Exam revealed a possible right sided cervical node.  Neck ultrasound on 08/30/2016 revealed no enlarged nodes.  Symptomatically, she notes a cold and sore throat since 08/30/2016.  She is using Mucinex and cough drops.  She concerned about noticeable left sided neck fullness.   Past Medical History:  Diagnosis Date  . ADD (attention deficit disorder)   . Anxiety attack   . Factor 5 Leiden mutation, heterozygous ([place]) 07/2016   hematology [place]  . Guttate psoriasis   . Mastitis, associated with childbirth 02/2012   required hospitalization and IV antibiotics  . Panic attack     Past Surgical History:  Procedure Laterality Date  . BIOPSY BREAST Bilateral 2008  . CESAREAN SECTION  02/26/2012   active phase arrest (FTP)  . 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 SINUS IRRIGATION    . TONSILLECTOMY    . WISDOM TOOTH EXTRACTION      Family History  Problem Relation Age of Onset  . Stroke Maternal Uncle   . Factor V Leiden deficiency Maternal Uncle   . Stroke Paternal Aunt   . Factor V Leiden deficiency Paternal Aunt   . Stroke Maternal Grandfather   . Melanoma Paternal Grandmother   . Cerebral aneurysm Paternal Grandmother   . Heart disease Paternal Grandfather     Social History:  reports that she has never smoked. She  has never used smokeless tobacco. She reports that she drinks alcohol. She reports that she does not use drugs.  She is active and does yoga.  She goes to the gym 3-5 x/week.  She works in [employer] at [employer].  The patient is alone today.  Allergies:  Allergies  Allergen Reactions  . Doxycycline Swelling  . Morphine Nausea Only    Current Medications: Current Outpatient Prescriptions  Medication Sig Dispense Refill  . amphetamine-dextroamphetamine (ADDERALL) 20 MG tablet Take 20 mg by mouth 2 (two) times daily as needed.    . clonazePAM (KLONOPIN) 0.5 MG tablet Take 0.5 mg by mouth 2 (two) times daily as needed for anxiety.    Marland Kitchen ibuprofen (ADVIL,MOTRIN) 600 MG tablet Take 1 tablet (600 mg total) by mouth every 6 (six) hours as needed for pain. 30 tablet 0  . ketoconazole (NIZORAL) 2 % shampoo APPLY TO SCALP 3 X WEEK,LEAVE ON 5 MINUTES, RINSE WELL.  10  . Vitamin D, Ergocalciferol, (DRISDOL) 50000 units CAPS capsule Take 50,000 Units by mouth every 7 (seven) days.    . Azelastine HCl 0.15 % SOLN USE AS DIRECTED 1-2 SPRAY PER NARES DAILY  1  . betamethasone dipropionate (DIPROLENE) 0.05 % cream APPLY TWICE DAILY TO AFFECTED AREAS  2  . fluocinonide (LIDEX) 0.05 % external solution APPLY TO AFFECTED SCALP TWICE DAILY UNTIL CLEAR  3  . RETIN-A 0.025 % cream APPLY A PEA SIZED AMOUNT TO DRY FACE NIGHTLY  3   No current facility-administered medications for this visit.     Review of Systems:  GENERAL:  Fatigue.  Constant 99 degree temperature.  No sweats or weight loss. PERFORMANCE STATUS (ECOG):  0 HEENT:  URI.  Sore throat.  No visual changes, mouth sores or tenderness. Lungs: No shortness of breath or cough.  No hemoptysis. Cardiac:  No chest pain, palpitations, orthopnea, or PND. GI:  No nausea, vomiting, diarrhea, constipation, melena or hematochezia. GU:  No urgency, frequency, dysuria, or hematuria. Musculoskeletal:  No back pain.  No joint pain.  No muscle tenderness. Extremities:   No pain or swelling. Skin:  No rashes or skin changes. Neuro:  No headache, numbness or weakness, balance or coordination issues. Endocrine:  No diabetes, thyroid issues, hot flashes or night sweats. Psych:  Anxiety. ADHD.  No mood changes or depression. Pain:  No focal pain. Review of systems:  All other systems reviewed and found to be negative.  Physical Exam: Blood pressure 111/68, pulse 88, temperature 98.6 F (37 C), temperature source Tympanic, resp. rate 18, weight 174 lb 5 oz (79.1 kg), last menstrual period 08/12/2016. GENERAL:  Well developed, well nourished, woman sitting comfortably in the exam room in no acute distress. MENTAL STATUS:  Alert and oriented to person, place and time. HEAD:  Long dark hair.  Normocephalic, atraumatic, face symmetric, no Cushingoid features. EYES:  Pupils equal round and reactive to light and accomodation.  No conjunctivitis or scleral icterus. ENT:  Oropharynx clear without lesion.  Tongue normal. Mucous membranes moist.  RESPIRATORY:  Clear to auscultation without rales, wheezes or rhonchi. CARDIOVASCULAR:  Regular rate and rhythm without murmur, rub or gallop. ABDOMEN:  Soft, non-tender, with active bowel sounds, and no hepatosplenomegaly.  No masses. SKIN:  Right arm tattoo.  No rashes, ulcers or lesions. EXTREMITIES: No edema, no skin discoloration or tenderness.  No palpable cords. LYMPH NODES:  Possible 2 cm right node under SCM.  No other palpable cervical, supraclavicular, axillary or inguinal adenopathy  NEUROLOGICAL: Unremarkable. PSYCH:  Appropriate.   No visits with results within 3 Day(s) from this visit.  Latest known visit with results is:  Appointment on 08/20/2016  Component Date Value Ref Range Status  . WBC 08/20/2016 6.3  3.6 - 11.0 K/uL Final  . RBC 08/20/2016 4.54  3.80 - 5.20 MIL/uL Final  . Hemoglobin 08/20/2016 14.1  12.0 - 16.0 g/dL Final  . HCT 08/20/2016 40.7  35.0 - 47.0 % Final  . MCV 08/20/2016 89.7  80.0 -  100.0 fL Final  . MCH 08/20/2016 31.1  26.0 - 34.0 pg Final  . MCHC 08/20/2016 34.7  32.0 - 36.0 g/dL Final  . RDW 08/20/2016 12.4  11.5 - 14.5 % Final  . Platelets 08/20/2016 264  150 - 440 K/uL Final  . Neutrophils Relative % 08/20/2016 60  % Final  . Neutro Abs 08/20/2016 3.8  1.4 - 6.5 K/uL Final  . Lymphocytes Relative 08/20/2016 31  % Final  . Lymphs Abs 08/20/2016 2.0  1.0 - 3.6 K/uL Final  . Monocytes Relative 08/20/2016 7  % Final  . Monocytes Absolute 08/20/2016 0.4  0.2 - 0.9 K/uL Final  . Eosinophils Relative 08/20/2016 1  % Final  . Eosinophils Absolute 08/20/2016 0.1  0 - 0.7 K/uL Final  . Basophils Relative 08/20/2016 1  % Final  . Basophils Absolute 08/20/2016 0.0  0 -  0.1 K/uL Final  . Sodium 08/20/2016 137  135 - 145 mmol/L Final  . Potassium 08/20/2016 4.2  3.5 - 5.1 mmol/L Final  . Chloride 08/20/2016 104  101 - 111 mmol/L Final  . CO2 08/20/2016 25  22 - 32 mmol/L Final  . Glucose, Bld 08/20/2016 98  65 - 99 mg/dL Final  . BUN 08/20/2016 13  6 - 20 mg/dL Final  . Creatinine, Ser 08/20/2016 0.63  0.44 - 1.00 mg/dL Final  . Calcium 08/20/2016 9.2  8.9 - 10.3 mg/dL Final  . Total Protein 08/20/2016 7.9  6.5 - 8.1 g/dL Final  . Albumin 08/20/2016 4.8  3.5 - 5.0 g/dL Final  . AST 08/20/2016 26  15 - 41 U/L Final  . ALT 08/20/2016 22  14 - 54 U/L Final  . Alkaline Phosphatase 08/20/2016 52  38 - 126 U/L Final  . Total Bilirubin 08/20/2016 0.8  0.3 - 1.2 mg/dL Final  . GFR calc non Af Amer 08/20/2016 >60  >60 mL/min Final  . GFR calc Af Amer 08/20/2016 >60  >60 mL/min Final   Comment: (NOTE) The eGFR has been calculated using the CKD EPI equation. This calculation has not been validated in all clinical situations. eGFR's persistently <60 mL/min signify possible Chronic Kidney Disease.   . Anion gap 08/20/2016 8  5 - 15 Final  . Antithrombin Activity 08/20/2016 109  75 - 135 % Final   Comment: (NOTE) Direct Xa inhibitor anticoagulants such as rivaroxaban, apixaban  and edoxaban will lead to spuriously elevated antithrombin activity levels possibly masking a deficiency.   . AT III AG PPP IMM-ACNC 08/20/2016 93  72 - 124 % Final   Comment: (NOTE) This test was developed and its performance characteristics determined by [facility]. It has not been cleared or approved by the Food and Drug Administration. Performed At: [facility] [address] [place], [place] [id number] Lindon Romp MD FV:[id number]     Assessment:  MANA HABERL is a 31 y.o. female with Factor V Leiden (heterozygote for R506Q mutation). Prothrombin gene mutation was not detected.  She has never had a clot.  She had an uneventful pregnancy in 2013.  She is active.  She does not smoke.  She is not on birth control pills.  Labs on 08/20/2016 included a normal CBC with diff, CMP, and ATIII panel (antigen 93%, activity 109%).  She has an apparent fullness behind the left sternocleidomastoid muscle. Neck ultrasound on 08/30/2016 revealed no enlarged nodes.  She has a chronic low grade temperature.  She has a URI and sore throat.  She denies any sweats or weight loss.  She has a recent history of a "midly enlarged spleen".  Exam reveals persistent left sided neck fullness/cervical node.  Plan: 1.  Review labs from last visit.  No abnormalities.  Review risk of thrombosis with Factor V Leiden heterozygote.  Discuss activity level, avoiding dehydration, avoiding immobility, avoiding birth control pills (choose another form of contraception), not smoking, and prophylactic anticoagulation for at risk times (pelvic and knee surgery, etc). 2.  Review neck ultrasound.  No adenopathy.  Patient remains concerned.  Exam reveals persistent asymmetry.  Discussed with radiology.  Recommendation for soft tissue neck CT.  Patient requests CT without contrast. 3.  Schedule neck CT without contrast. 4.  Call patient with results. 5.  RTC prn.   Lequita Asal, MD  09/02/2016

## 2016-09-02 NOTE — Progress Notes (Signed)
Patient states she has had a cold with sore throat since Monday this week.  She is taking mucinex and cough drops.

## 2016-09-10 ENCOUNTER — Encounter: Payer: Self-pay | Admitting: Certified Nurse Midwife

## 2016-09-10 ENCOUNTER — Ambulatory Visit (INDEPENDENT_AMBULATORY_CARE_PROVIDER_SITE_OTHER): Payer: 59 | Admitting: Certified Nurse Midwife

## 2016-09-10 VITALS — BP 102/62 | HR 75 | Ht 64.5 in | Wt 175.0 lb

## 2016-09-10 DIAGNOSIS — D6851 Activated protein C resistance: Secondary | ICD-10-CM

## 2016-09-10 DIAGNOSIS — Z113 Encounter for screening for infections with a predominantly sexual mode of transmission: Secondary | ICD-10-CM

## 2016-09-10 DIAGNOSIS — Z124 Encounter for screening for malignant neoplasm of cervix: Secondary | ICD-10-CM | POA: Diagnosis not present

## 2016-09-10 DIAGNOSIS — Z01419 Encounter for gynecological examination (general) (routine) without abnormal findings: Secondary | ICD-10-CM | POA: Diagnosis not present

## 2016-09-10 NOTE — Progress Notes (Signed)
Gynecology Annual Exam  PCP: Volanda Napoleon, MD  Chief Complaint:  Chief Complaint  Patient presents with  . Gynecologic Exam    History of Present Illness: Patient is a 31 y.o. [id number] presents for annual exam. The patient has no significant gynecological complaints today. Her menses are monthly but vary in length from 1-5 days, and are usually light in flow. Her LMP was mid February. She is sexually active and uses condoms for birth control. She recently found out she is heterozygous for Factor V Leiden. Both her maternal aunt and uncle had strokes and both are positive for Factor V Leiden and her uncle also has Prothrombin [id number] mutation. She has been followed by Dr Mike Gip, hematology. She is currently undergoing an evaluation for lower back pain and abdominal discomfort and bloating. She has had a abdominal and kidney ultrasound and labs have been done thru [facility]. She sees a dermatologist frequently and has had several dysplastic moles removed. She was also diagnosed with guttate psoriasis. Her PCP has prescribed her the Klonopin for anxiety and Adderall for ADD. She also has chronic problems with sinuses/allergies.   Last annual exam was 12/02/2014 and Pap was NIL at that time There is no history of breast or ovarian cancer. She does do self breast exams She does not smoke  And drinks alcohol occasionally She does exercise 3-5x/week at the gym., but has gained 17# since her last exam. Her current BMI=29.57. She may not get enough calcium in her diet.    Review of Systems: Review of Systems  Constitutional: Negative for chills, fever and weight loss.       Positive for weight gain  HENT: Positive for sinus pain and sore throat. Negative for congestion.        Positive for nasal congestion/ PND  Eyes: Negative for blurred vision and pain.  Respiratory: Negative for hemoptysis, shortness of breath and wheezing.   Cardiovascular: Negative for chest pain,  palpitations and leg swelling.  Gastrointestinal: Negative for abdominal pain, blood in stool, diarrhea, heartburn, nausea and vomiting.       Positive for bloating  Genitourinary: Negative for dysuria, frequency, hematuria and urgency.  Musculoskeletal: Positive for back pain (left lumbar pain, intermittent). Negative for joint pain and myalgias.  Skin: Positive for rash. Negative for itching.  Neurological: Negative for dizziness, tingling and headaches.  Endo/Heme/Allergies: Negative for environmental allergies and polydipsia. Does not bruise/bleed easily.       Negative for hirsutism   Psychiatric/Behavioral: Negative for depression. The patient is nervous/anxious. The patient does not have insomnia.      Past Medical History:  Past Medical History:  Diagnosis Date  . ADD (attention deficit disorder)   . Anxiety attack   . Factor 5 Leiden mutation, heterozygous ([place]) 07/2016   hematology [place]  . Guttate psoriasis   . Mastitis, associated with childbirth 02/2012   required hospitalization and IV antibiotics  . Panic attack     Past Surgical History:  Past Surgical History:  Procedure Laterality Date  . BIOPSY BREAST Bilateral 2008  . CESAREAN SECTION  02/26/2012   active phase arrest (FTP)  . 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 SINUS IRRIGATION    . TONSILLECTOMY    . WISDOM TOOTH EXTRACTION      Medications: Prior to Admission medications   Medication Sig Start Date End Date Taking? Authorizing Provider  amphetamine-dextroamphetamine (ADDERALL) 20 MG tablet Take 20 mg by mouth 2 (two) times  daily as needed.   Yes Historical Provider, MD  Azelastine HCl 0.15 % SOLN USE AS DIRECTED 1-2 SPRAY PER NARES DAILY 08/02/16  Yes Historical Provider, MD  betamethasone dipropionate (DIPROLENE) 0.05 % cream APPLY TWICE DAILY TO AFFECTED AREAS 08/30/16  Yes Historical Provider, MD  clonazePAM (KLONOPIN) 0.5 MG tablet Take 0.5 mg by mouth 2 (two) times daily as needed for  anxiety.   Yes Historical Provider, MD  fluocinonide (LIDEX) 0.05 % external solution APPLY TO AFFECTED SCALP TWICE DAILY UNTIL CLEAR 08/30/16  Yes Historical Provider, MD  ibuprofen (ADVIL,MOTRIN) 600 MG tablet Take 1 tablet (600 mg total) by mouth every 6 (six) hours as needed for pain. 03/23/13  Yes Dorie Rank, MD  ketoconazole (NIZORAL) 2 % shampoo APPLY TO SCALP 3 X WEEK,LEAVE ON 5 MINUTES, RINSE WELL. 04/30/16  Yes Historical Provider, MD  Vitamin D, Ergocalciferol, (DRISDOL) 50000 units CAPS capsule Take 50,000 Units by mouth every 7 (seven) days.   Yes Historical Provider, MD  hydrocortisone cream 1 % Apply to affected area 2 times daily Patient not taking: Reported on 05/26/2016 03/23/13   Dorie Rank, MD  RETIN-A 0.025 % cream APPLY A PEA SIZED AMOUNT TO DRY FACE NIGHTLY 04/30/16   Historical Provider, MD    Allergies:  Allergies  Allergen Reactions  . Doxycycline Swelling  . Morphine Nausea Only    Social History:  Social History   Social History  . Marital status: Single    Spouse name: N/A  . Number of children: 1  . 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 Smoking status: Never Smoker  . Smokeless tobacco: Never Used  . Alcohol use Yes     Comment: occasional  . Drug use: No  . Sexual activity: Yes    Birth control/ protection: Condom   Other Topics Concern  . Not on file   Social History Narrative  . No narrative on file    Family History:  Family History  Problem Relation Age of Onset  . Stroke Maternal Uncle   . Factor V Leiden deficiency Maternal Uncle   . Stroke Paternal Aunt   . Factor V Leiden deficiency Paternal Aunt   . Stroke Maternal Grandfather   . Melanoma Paternal Grandmother   . Cerebral aneurysm Paternal Grandmother   . Heart disease Paternal Grandfather      Physical Exam Vitals:  Vitals:   09/10/16 1024  BP: 102/62  Pulse: 75    Physical Exam   Constitutional: She is oriented to person, place, and time and well-developed, well-nourished, and in no distress.  HENT:  Head: Normocephalic.  Eyes: Conjunctivae are normal.  Neck: Normal range of motion. No thyromegaly present.  Cardiovascular: Normal rate and regular rhythm.   No murmur heard. Pulmonary/Chest: Effort normal. She has wheezes (mild inspiratory wheeze in right base). She has no rales. She exhibits no tenderness.  Abdominal: Soft. She exhibits no distension and no mass. There is no tenderness. There is no guarding.  Genitourinary:  Genitourinary Comments: Vulva: no lesions or inflammation. No discharge Vagina: no masses, clear to white mucoepithelial discharge Cervix: no lesions, anterior, points to right Uterus: RV, NSSC, NT, decreased mobility Adnexa: no masses or tenderness bilaterally.  Musculoskeletal: She exhibits no edema.  Lymphadenopathy:       Head (left side): Submental adenopathy: no inguinal adenopathy.    She has no cervical adenopathy.  She has no axillary adenopathy.       Right: No inguinal and no supraclavicular adenopathy present.       Left: No inguinal adenopathy present.  Neurological: She is alert and oriented to person, place, and time. Gait normal.  Skin: Skin is warm and dry. No rash noted.  Psychiatric: Mood, affect and judgment normal.  Breast: symmetrical, soft, no masses, no skin changes, no nipple discharge Female chaperone present for pelvic and breast  portions of the physical exam  Assessment: P-30 well woman exam Factor V Leiden mutation    PLAN: 1) Contraception Education given regarding options for contraception with the Factor V Leiden mutation which places her at risk for DVT. Discussed contraception without estrogen including injectable contraception, IUD placement, progesterone only pills, or the Nexplanon. She is interested in the [place] and a pamphlet was given for her to review. Patient to let me know if she desires  so it can be ordered. and insertion scheduled.. Patient is aware that pregnancy can also increase risk of DVT and she may need to be on anticoagulants  2) STI screening was offered and done with her Pap  3) Routine healthcare maintenance including cholesterol, diabetes screening may have been done recently by her PCP.  4) Follow up 1 year for routine annual exam if does not desire IUD  5) continue monthly self breast exams.  Dalia Heading, CNM  09/11/2016 2:18 PM

## 2016-09-11 ENCOUNTER — Encounter: Payer: Self-pay | Admitting: Certified Nurse Midwife

## 2016-09-11 DIAGNOSIS — F419 Anxiety disorder, unspecified: Secondary | ICD-10-CM | POA: Insufficient documentation

## 2016-09-11 DIAGNOSIS — L404 Guttate psoriasis: Secondary | ICD-10-CM | POA: Insufficient documentation

## 2016-09-11 DIAGNOSIS — Z98891 History of uterine scar from previous surgery: Secondary | ICD-10-CM | POA: Insufficient documentation

## 2016-09-11 DIAGNOSIS — F988 Other specified behavioral and emotional disorders with onset usually occurring in childhood and adolescence: Secondary | ICD-10-CM

## 2016-09-11 HISTORY — DX: Other specified behavioral and emotional disorders with onset usually occurring in childhood and adolescence: F98.8

## 2016-09-13 LAB — PAP IG, CT-NG, RFX HPV ALL
CHLAMYDIA, NUC. ACID AMP: NEGATIVE
GONOCOCCUS BY NUCLEIC ACID AMP: NEGATIVE
PAP Smear Comment: 0

## 2016-09-16 ENCOUNTER — Encounter: Payer: Self-pay | Admitting: Hematology and Oncology

## 2016-09-16 ENCOUNTER — Encounter: Payer: Self-pay | Admitting: Certified Nurse Midwife

## 2016-09-24 ENCOUNTER — Ambulatory Visit: Payer: 59

## 2016-09-27 ENCOUNTER — Telehealth: Payer: 59 | Admitting: Family

## 2016-09-27 DIAGNOSIS — J028 Acute pharyngitis due to other specified organisms: Secondary | ICD-10-CM | POA: Diagnosis not present

## 2016-09-27 DIAGNOSIS — B9689 Other specified bacterial agents as the cause of diseases classified elsewhere: Secondary | ICD-10-CM

## 2016-09-27 MED ORDER — BENZONATATE 100 MG PO CAPS: 100.0000 mg | ORAL_CAPSULE | Freq: Three times a day (TID) | ORAL | 0 refills | Status: DC | PRN

## 2016-09-27 MED ORDER — PREDNISONE 5 MG PO TABS: 5.0000 mg | ORAL_TABLET | ORAL | 0 refills | Status: DC

## 2016-09-27 MED ORDER — LEVOFLOXACIN 500 MG PO TABS: 500.0000 mg | ORAL_TABLET | Freq: Every day | ORAL | 0 refills | Status: DC

## 2016-09-27 NOTE — Progress Notes (Signed)
We are sorry that you are not feeling well.  Here is how we plan to help!  Based on what you have shared with me it looks like you have upper respiratory tract inflammation that has resulted in a significant cough.  Inflammation and infection in the upper respiratory tract is commonly called bronchitis and has four common causes:  Allergies, Viral Infections, Acid Reflux and Bacterial Infections.  Allergies, viruses and acid reflux are treated by controlling symptoms or eliminating the cause. An example might be a cough caused by taking certain blood pressure medications. You stop the cough by changing the medication. Another example might be a cough caused by acid reflux. Controlling the reflux helps control the cough.  Based on your presentation I believe you most likely have A cough due to bacteria.  When patients have a fever and a productive cough with a change in color or increased sputum production, we are concerned about bacterial bronchitis.  If left untreated it can progress to pneumonia.  If your symptoms do not improve with your treatment plan it is important that you contact your provider.   I hve prescribed Levofloxacin 500 mg daily for 7 days    In addition you may use A non-prescription cough medication called Mucinex DM: take 2 tablets every 12 hours. and A prescription cough medication called Tessalon Perles 100mg . You may take 1-2 capsules every 8 hours as needed for your cough.  Sterapred 5 mg dosepak  USE OF BRONCHODILATOR ("RESCUE") INHALERS: There is a risk from using your bronchodilator too frequently.  The risk is that over-reliance on a medication which only relaxes the muscles surrounding the breathing tubes can reduce the effectiveness of medications prescribed to reduce swelling and congestion of the tubes themselves.  Although you feel brief relief from the bronchodilator inhaler, your asthma may actually be worsening with the tubes becoming more swollen and filled with  mucus.  This can delay other crucial treatments, such as oral steroid medications. If you need to use a bronchodilator inhaler daily, several times per day, you should discuss this with your provider.  There are probably better treatments that could be used to keep your asthma under control.     HOME CARE . Only take medications as instructed by your medical team. . Complete the entire course of an antibiotic. . Drink plenty of fluids and get plenty of rest. . Avoid close contacts especially the very young and the elderly . Cover your mouth if you cough or cough into your sleeve. . Always remember to wash your hands . A steam or ultrasonic humidifier can help congestion.   GET HELP RIGHT AWAY IF: . You develop worsening fever. . You become short of breath . You cough up blood. . Your symptoms persist after you have completed your treatment plan MAKE SURE YOU   Understand these instructions.  Will watch your condition.  Will get help right away if you are not doing well or get worse.  Your e-visit answers were reviewed by a board certified advanced clinical practitioner to complete your personal care plan.  Depending on the condition, your plan could have included both over the counter or prescription medications. If there is a problem please reply  once you have received a response from your provider. Your safety is important to [place].  If you have drug allergies check your prescription carefully.    You can use MyChart to ask questions about today's visit, request a non-urgent call back, or ask  for a work or school excuse for 24 hours related to this e-Visit. If it has been greater than 24 hours you will need to follow up with your provider, or enter a new e-Visit to address those concerns. You will get an e-mail in the next two days asking about your experience.  I hope that your e-visit has been valuable and will speed your recovery. Thank you for using e-visits.

## 2016-10-01 DIAGNOSIS — F341 Dysthymic disorder: Secondary | ICD-10-CM | POA: Diagnosis not present

## 2016-10-01 DIAGNOSIS — J329 Chronic sinusitis, unspecified: Secondary | ICD-10-CM | POA: Diagnosis not present

## 2016-10-01 DIAGNOSIS — L130 Dermatitis herpetiformis: Secondary | ICD-10-CM | POA: Diagnosis not present

## 2016-10-01 DIAGNOSIS — D6851 Activated protein C resistance: Secondary | ICD-10-CM | POA: Diagnosis not present

## 2016-10-01 DIAGNOSIS — F909 Attention-deficit hyperactivity disorder, unspecified type: Secondary | ICD-10-CM | POA: Diagnosis not present

## 2016-10-01 DIAGNOSIS — M545 Low back pain: Secondary | ICD-10-CM | POA: Diagnosis not present

## 2016-10-04 ENCOUNTER — Other Ambulatory Visit: Payer: Self-pay | Admitting: *Deleted

## 2016-10-04 DIAGNOSIS — R590 Localized enlarged lymph nodes: Secondary | ICD-10-CM

## 2016-10-04 DIAGNOSIS — L988 Other specified disorders of the skin and subcutaneous tissue: Secondary | ICD-10-CM | POA: Diagnosis not present

## 2016-10-04 DIAGNOSIS — J301 Allergic rhinitis due to pollen: Secondary | ICD-10-CM | POA: Diagnosis not present

## 2016-10-04 DIAGNOSIS — J320 Chronic maxillary sinusitis: Secondary | ICD-10-CM | POA: Diagnosis not present

## 2016-10-04 DIAGNOSIS — K219 Gastro-esophageal reflux disease without esophagitis: Secondary | ICD-10-CM | POA: Diagnosis not present

## 2016-10-04 DIAGNOSIS — R221 Localized swelling, mass and lump, neck: Secondary | ICD-10-CM | POA: Diagnosis not present

## 2016-10-04 DIAGNOSIS — L404 Guttate psoriasis: Secondary | ICD-10-CM | POA: Diagnosis not present

## 2016-10-13 ENCOUNTER — Ambulatory Visit: Payer: 59

## 2016-10-13 ENCOUNTER — Ambulatory Visit
Admission: RE | Admit: 2016-10-13 | Discharge: 2016-10-13 | Disposition: A | Discharge status: Home / Self Care | Payer: 59 | Source: Ambulatory Visit | Attending: Hematology and Oncology | Admitting: Hematology and Oncology

## 2016-10-13 DIAGNOSIS — R590 Localized enlarged lymph nodes: Secondary | ICD-10-CM | POA: Diagnosis not present

## 2016-10-13 DIAGNOSIS — Z9889 Other specified postprocedural states: Secondary | ICD-10-CM | POA: Insufficient documentation

## 2016-10-13 DIAGNOSIS — R221 Localized swelling, mass and lump, neck: Secondary | ICD-10-CM | POA: Diagnosis not present

## 2016-10-13 MED ORDER — IOPAMIDOL (ISOVUE-300) INJECTION 61%
75.0000 mL | Freq: Once | INTRAVENOUS | Status: AC | PRN
  Administered 2016-10-13: 75 mL via INTRAVENOUS

## 2016-10-14 DIAGNOSIS — R221 Localized swelling, mass and lump, neck: Secondary | ICD-10-CM | POA: Diagnosis not present

## 2016-10-14 DIAGNOSIS — J301 Allergic rhinitis due to pollen: Secondary | ICD-10-CM | POA: Diagnosis not present

## 2016-11-04 ENCOUNTER — Encounter: Payer: Self-pay | Admitting: Certified Nurse Midwife

## 2016-11-04 ENCOUNTER — Ambulatory Visit: Payer: 59 | Admitting: Certified Nurse Midwife

## 2016-11-04 ENCOUNTER — Ambulatory Visit (INDEPENDENT_AMBULATORY_CARE_PROVIDER_SITE_OTHER): Payer: 59 | Admitting: Certified Nurse Midwife

## 2016-11-04 ENCOUNTER — Other Ambulatory Visit: Payer: Self-pay | Admitting: Certified Nurse Midwife

## 2016-11-04 ENCOUNTER — Ambulatory Visit (INDEPENDENT_AMBULATORY_CARE_PROVIDER_SITE_OTHER): Payer: 59

## 2016-11-04 VITALS — BP 100/60 | HR 78 | Ht 64.5 in | Wt 169.0 lb

## 2016-11-04 DIAGNOSIS — R1032 Left lower quadrant pain: Secondary | ICD-10-CM

## 2016-11-04 DIAGNOSIS — N926 Irregular menstruation, unspecified: Secondary | ICD-10-CM

## 2016-11-04 DIAGNOSIS — N83202 Unspecified ovarian cyst, left side: Secondary | ICD-10-CM | POA: Diagnosis not present

## 2016-11-05 NOTE — Progress Notes (Signed)
Obstetrics & Gynecology Office Visit   Chief Complaint:  Chief Complaint  Patient presents with  . Menstrual Problem    spotting and painful intercourse, cramping on left side    History of Present Illness: 31 year old G 5 P1041 who just had her normal annual exam in March 2018 presents with complaints of sudden onset LLQ pain when having intercourse May 2. The pain was sharp and intense initially, but has subsided to a dull aching constant pain.  Her LMP was April 21, but she spotted May 3 for x day. She currently uses condoms for birth control. She has not taken anything for pain. She denies any GI or GU complaints associated with pain. Denies fever or chills.   Review of Systems:  Review of Systems  Constitutional: Negative for chills and fever.  Gastrointestinal: Positive for abdominal pain. Negative for blood in stool, constipation, diarrhea, nausea and vomiting.  Genitourinary: Negative for dysuria, frequency and hematuria.  Musculoskeletal: Negative for back pain.  Psychiatric/Behavioral: The patient is nervous/anxious.      Past Medical History:  Past Medical History:  Diagnosis Date  . ADD (attention deficit disorder)   . Anxiety attack   . Factor 5 Leiden mutation, heterozygous ([place]) 07/2016   hematology [place]  . Guttate psoriasis   . Mastitis, associated with childbirth 02/2012   required hospitalization and IV antibiotics  . Panic attack     Past Surgical History:  Past Surgical History:  Procedure Laterality Date  . BIOPSY BREAST Bilateral 2008  . CESAREAN SECTION  02/26/2012   active phase arrest (FTP)  . 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 SINUS IRRIGATION    . TONSILLECTOMY    . WISDOM TOOTH EXTRACTION      Gynecologic History: Patient's last menstrual period was 10/16/2016 (exact date).  Obstetric History: [id number]  Family History:  Family History  Problem Relation Age of Onset  . Stroke Maternal Uncle   . Factor V Leiden deficiency  Maternal Uncle   . Stroke Paternal Aunt   . Factor V Leiden deficiency Paternal Aunt   . Stroke Maternal Grandfather   . Melanoma Paternal Grandmother   . Cerebral aneurysm Paternal Grandmother   . Heart disease Paternal Grandfather     Social History:  Social History   Social History  . Marital status: Single    Spouse name: N/A  . Number of children: 1  . 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 Smoking status: Never Smoker  . Smokeless tobacco: Never Used  . Alcohol use Yes     Comment: occasional  . Drug use: No  . Sexual activity: Yes    Birth control/ protection: Condom   Other Topics Concern  . Not on file   Social History Narrative  . No narrative on file    Allergies:  Allergies  Allergen Reactions  . Doxycycline Swelling  . Morphine Nausea Only    Medications: Prior to Admission medications   Medication Sig Start Date End Date Taking? Authorizing Provider  amphetamine-dextroamphetamine (ADDERALL) 20 MG tablet Take 20 mg by mouth 2 (two) times daily as needed.    [provider]  Azelastine HCl 0.15 % SOLN USE AS DIRECTED 1-2 SPRAY PER NARES DAILY 08/02/16   [provider]  betamethasone dipropionate (DIPROLENE) 0.05 % cream APPLY TWICE DAILY TO AFFECTED AREAS 08/30/16  [provider]  clonazePAM (KLONOPIN) 0.5 MG tablet Take 0.5 mg by mouth 2 (two) times daily as needed for anxiety.    [provider]  fluocinonide (LIDEX) 0.05 % external solution APPLY TO AFFECTED SCALP TWICE DAILY UNTIL CLEAR 08/30/16   [provider]  ibuprofen (ADVIL,MOTRIN) 600 MG tablet Take 1 tablet (600 mg total) by mouth every 6 (six) hours as needed for pain. 03/23/13   Dorie Rank, MD  ketoconazole (NIZORAL) 2 % shampoo APPLY TO SCALP 3 X WEEK,LEAVE ON 5 MINUTES, RINSE WELL. 04/30/16   [provider]  montelukast (SINGULAIR) 10 MG tablet Take 10 mg by mouth  every evening. 10/20/16   [provider]  RETIN-A 0.025 % cream APPLY A PEA SIZED AMOUNT TO DRY FACE NIGHTLY 04/30/16   [provider]  Vitamin D, Ergocalciferol, (DRISDOL) 50000 units CAPS capsule Take 50,000 Units by mouth every 7 (seven) days.    [provider]    Physical Exam Vitals:  Vitals:   11/04/16 1135  BP: 100/60  Pulse: 78   Patient's last menstrual period was 10/16/2016 (exact date).  Physical Exam  Constitutional: She is oriented to person, place, and time. She appears well-developed and well-nourished.  Anxious appearing, but in NAD  HENT:  Head: Normocephalic and atraumatic.  Respiratory: She is in respiratory distress.  GI: Soft. Bowel sounds are normal. She exhibits no distension and no mass. There is no tenderness. There is no rebound and no guarding.  Genitourinary:  Genitourinary Comments: Vulva: no lesions or inflammation Vagina: white discharge Cervix: no lesions, no bleeding, NT Uterus: AF, NSSC, mobile, NT Adnexa: tenderness in left adnexa, no masses palpable bilaterally, NT on right  Wet prep: negative for hyphae, Trich, clue cells.  Neurological: She is alert and oriented to person, place, and time.  Skin: Skin is warm and dry. No rash noted.  Psychiatric: Her speech is normal and behavior is normal. Thought content normal. Her mood appears anxious.     Assessment: 31 y.o. [id number] with LLQ pain R/O ruptured ovarian cyst  Plan: TVUS ordered today. FU with me after ultrasound  Dalia Heading, CNM   Addendum: S: Is more uncomfortable after exam and [place] O: TVUS : small (17 x 16 mm) complex cyst on left ovary ? Hemorrhagic cyst. No free fluid. No fibroids. EM 8.5 mm A: LLQ pain with posssible left hemorrhagic cyst P: advised patient that pain should subside over the next 2 weeks. Repeat ultrasound in 6-8 weeks. Motrin 600 mgm q6hr prn pain Work excuse x 2 days  Dalia Heading, CNM

## 2016-11-07 NOTE — Progress Notes (Signed)
See Addendum on earlier note this date  Dalia Heading, CNM  This encounter was created in error - please disregard.

## 2016-11-10 ENCOUNTER — Other Ambulatory Visit
Admission: RE | Admit: 2016-11-10 | Discharge: 2016-11-10 | Disposition: A | Discharge status: Home / Self Care | Payer: 59 | Source: Ambulatory Visit | Attending: Gastroenterology | Admitting: Gastroenterology

## 2016-11-10 ENCOUNTER — Ambulatory Visit (INDEPENDENT_AMBULATORY_CARE_PROVIDER_SITE_OTHER): Payer: 59 | Admitting: Gastroenterology

## 2016-11-10 ENCOUNTER — Encounter: Payer: Self-pay | Admitting: Gastroenterology

## 2016-11-10 ENCOUNTER — Other Ambulatory Visit: Payer: Self-pay

## 2016-11-10 VITALS — BP 92/60 | HR 71 | Temp 98.2°F | Ht 64.5 in | Wt 170.0 lb

## 2016-11-10 DIAGNOSIS — R140 Abdominal distension (gaseous): Secondary | ICD-10-CM

## 2016-11-10 LAB — PREGNANCY, URINE: PREG TEST UR: NEGATIVE

## 2016-11-10 LAB — TSH: TSH: 0.952 u[IU]/mL (ref 0.350–4.500)

## 2016-11-10 MED ORDER — POLYETHYLENE GLYCOL 3350 17 GM/SCOOP PO POWD: ORAL | 3 refills | Status: DC

## 2016-11-10 NOTE — Progress Notes (Signed)
Gastroenterology Consultation  Referring Provider:     Jodi Marble, MD Primary Care Physician:  Jodi Marble, MD Primary Gastroenterologist:  Dr. Jonathon Bellows  Reason for Consultation:     Abdominal pain         HPI:   Cynthia Davidson is a 31 y.o. y/o female she says was referred by her self   She was seen by Dr Danise Mina on 11/04/16 when she mentioned about sudden onset LLQ pain after sexual intercourse on may 2nd . On pelvic exam noted to have tenderness in the left adnexa , noted on TV ultrasound with a complex cyst in the left ovary , no free fluid. Impression was a possible left hemorrhagic cyst . Plan was for repeat ultrasound in 6-8 weeks .   Abdominal pain: Onset: seen by GI in 2015 , says pain ongoing since C section in 2013 , when she sneezes gets a sharp pain , overall it has been stable. She feels bloated every day , growling of her abdomen . Pain is not the major issue more has issues with bloating and growling .  Can occur before or after meals, gas is sometimes foul smelling , when she is bloated she "looks pregnant ", feels like her intestines are in a knot, when bloating is worse , has more abdominal discomfort. She does have associated nausea. The abdominal pain she was seen by GYN for has got much better.   Denies use of any artificial sugars, crytalite , stevia , chewing gum, sodas. Cant recall any particular foods which make her bloated.   Works out a few times a week and finds it hard to lose weight . CBC,CMP normal in 07/2016   She has a bowel movement every other or every two days. Denies passing hard stool .    Denies use of NSAID's on a regular basis. Only abdominal surgery was a c section    She also complains of a bulging sensation in her epigastrium on and off , none today   Past Medical History:  Diagnosis Date  . ADD (attention deficit disorder)   . Anxiety attack   . Factor 5 Leiden mutation, heterozygous ([place]) 07/2016   hematology  [place]  . Guttate psoriasis   . Mastitis, associated with childbirth 02/2012   required hospitalization and IV antibiotics  . Panic attack     Past Surgical History:  Procedure Laterality Date  . BIOPSY BREAST Bilateral 2008  . CESAREAN SECTION  02/26/2012   active phase arrest ([facility])  . 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 SINUS IRRIGATION    . TONSILLECTOMY    . WISDOM TOOTH EXTRACTION      Prior to Admission medications   Medication Sig Start Date End Date Taking? Authorizing Provider  amphetamine-dextroamphetamine (ADDERALL) 20 MG tablet Take 20 mg by mouth 2 (two) times daily as needed.   Yes [provider]  clonazePAM (KLONOPIN) 0.5 MG tablet Take 0.5 mg by mouth 2 (two) times daily as needed for anxiety.   Yes [provider]  ibuprofen (ADVIL,MOTRIN) 600 MG tablet Take 1 tablet (600 mg total) by mouth every 6 (six) hours as needed for pain. 03/23/13  Yes Dorie Rank, MD  Vitamin D, Ergocalciferol, (DRISDOL) 50000 units CAPS capsule Take 50,000 Units by mouth every 7 (seven) days.   Yes [provider]  Azelastine HCl 0.15 % SOLN USE AS DIRECTED 1-2 SPRAY PER NARES DAILY 08/02/16   [provider]  betamethasone dipropionate (DIPROLENE) 0.05 % cream APPLY TWICE DAILY TO AFFECTED AREAS 08/30/16   [provider]  fluocinonide (LIDEX) 0.05 % external solution APPLY TO AFFECTED SCALP TWICE DAILY UNTIL CLEAR 08/30/16   [provider]  ketoconazole (NIZORAL) 2 % shampoo APPLY TO SCALP 3 X WEEK,LEAVE ON 5 MINUTES, RINSE WELL. 04/30/16   [provider]  montelukast (SINGULAIR) 10 MG tablet Take 10 mg by mouth every evening. 10/20/16   [provider]  RETIN-A 0.025 % cream APPLY A PEA SIZED AMOUNT TO DRY FACE NIGHTLY 04/30/16   [provider]    Family History  Problem Relation Age of Onset  . Stroke Maternal Uncle   . Factor V Leiden deficiency Maternal Uncle   . Stroke Paternal Aunt   . Factor V  Leiden deficiency Paternal Aunt   . Stroke Maternal Grandfather   . Melanoma Paternal Grandmother   . Cerebral aneurysm Paternal Grandmother   . Heart disease Paternal Grandfather      Social History  Substance Use Topics  . Smoking status: Never Smoker  . Smokeless tobacco: Never Used  . Alcohol use Yes     Comment: occasional    Allergies as of 11/10/2016 - Review Complete 11/10/2016  Allergen Reaction Noted  . Doxycycline Swelling 05/25/2011  . Morphine Nausea Only 11/08/2013    Review of Systems:    All systems reviewed and negative except where noted in HPI.   Physical Exam:  BP 92/60 (BP Location: Right Arm, Patient Position: Sitting, Cuff Size: Normal)   Pulse 71   Temp 98.2 F (36.8 C) (Oral)   Ht 5' 4.5" (1.638 m)   Wt 170 lb (77.1 kg)   LMP 10/16/2016 (Exact Date)   BMI 28.73 kg/m  Patient's last menstrual period was 10/16/2016 (exact date). Psych:  Alert and cooperative. Normal mood and affect. General:   Alert,  Well-developed, well-nourished, pleasant and cooperative in NAD Head:  Normocephalic and atraumatic. Eyes:  Sclera clear, no icterus.   Conjunctiva pink. Ears:  Normal auditory acuity. Nose:  No deformity, discharge, or lesions. Mouth:  No deformity or lesions,oropharynx pink & moist. Neck:  Supple; no masses or thyromegaly. Lungs:  Respirations even and unlabored.  Clear throughout to auscultation.   No wheezes, crackles, or rhonchi. No acute distress. Heart:  Regular rate and rhythm; no murmurs, clicks, rubs, or gallops. Abdomen:  [place] piercing Normal bowel sounds.  No bruits.  Soft, non-tender and non-distended without masses, hepatosplenomegaly or hernias noted.  No guarding or rebound tenderness.    Msk:  Symmetrical without gross deformities. Good, equal movement & strength bilaterally. Neurologic:  Alert and oriented x3;  grossly normal neurologically. Skin:  Intact without significant lesions or rashes. No jaundice.Tatoos over arms  Lymph  Nodes:  No significant cervical adenopathy. Psych:  Alert and cooperative. Normal mood and affect.  Imaging Studies: Ct Soft Tissue Neck W Contrast  Result Date: 10/13/2016 CLINICAL DATA:  31 year old female with right side neck swelling for 2 months. Negative neck ultrasound in March. EXAM: CT NECK WITH CONTRAST TECHNIQUE: Multidetector CT imaging of the neck was performed using the standard protocol following the bolus administration of intravenous contrast. CONTRAST:  16mL ISOVUE-300 IOPAMIDOL (ISOVUE-300) INJECTION 61% COMPARISON:  Neck ultrasound 08/30/2016.  Brain MRI 04/09/2015 FINDINGS: Pharynx and larynx: Negative larynx. Pharyngeal soft tissue contours are normal. Negative parapharyngeal and retropharyngeal spaces. Salivary glands: Negative sublingual space. Bilateral submandibular glands and parotid glands are symmetric and within normal limits. Thyroid:  Negative. Lymph nodes: Bilateral cervical lymph nodes appear symmetric and within normal limits. The largest nodes are at the bilateral level IIa stations measuring 7-8 mm short axis. No lymphadenopathy.  No cystic or necrotic nodes. No left neck mass identified. Vascular: Major vascular structures in the neck and at the skullbase appear patent and normal. Limited intracranial: Negative. Visualized orbits: Negative. Mastoids and visualized paranasal sinuses: Mild mucosal thickening and bubbly opacity in the left maxillary sinus, regressed compared to 2016. Other Visualized paranasal sinuses and mastoids are stable and well pneumatized. Skeleton: Previous ORIF of the bilateral maxillary sinus walls. Previous ORIF of the mandible symphysis. No acute osseous abnormality identified. Mild upper thoracic scoliosis. Upper chest: Negative lung apices. Negative visualized superior mediastinum; small volume residual thymus. Visible left axillary lymph nodes are normal. IMPRESSION: 1. Normal neck soft tissues. No neck mass or lymphadenopathy identified. 2.  Previous bilateral maxillary sinus and mandible symphysis ORIF. Mild left maxillary sinus inflammation, regressed compared to 2016. Electronically Signed   By: Genevie Ann M.D.   On: 10/13/2016 15:19    Assessment and Plan:   Cynthia Davidson is a 31 y.o. y/o female here to see me for bloating , gas, occasionally sensing a bulge in the epigastrium. Her symptoms may be likely due to [place] production secondary to constipation or SIBO.  Plan  1. Pregnancy test followed by CT scan of abdomen and pelvis with contrast  2. Miralax daily and if bloating is improved then constipation likely the cause if not she would like to undergo breath testing for SIBO before treatment  3. LOW FODMAP diet  4. Celiac serology , TSH  Follow up in 2-3 weeks   Dr Jonathon Bellows MD

## 2016-11-11 ENCOUNTER — Other Ambulatory Visit: Payer: Self-pay

## 2016-11-11 DIAGNOSIS — R140 Abdominal distension (gaseous): Secondary | ICD-10-CM

## 2016-11-11 LAB — CELIAC DISEASE PANEL
Endomysial Ab, IgA: NEGATIVE
IGA: 148 mg/dL (ref 87–352)

## 2016-11-16 ENCOUNTER — Encounter: Payer: Self-pay | Admitting: Gastroenterology

## 2016-11-17 ENCOUNTER — Ambulatory Visit: Payer: 59

## 2016-11-17 ENCOUNTER — Ambulatory Visit
Admission: RE | Admit: 2016-11-17 | Discharge: 2016-11-17 | Disposition: A | Discharge status: Home / Self Care | Payer: 59 | Source: Ambulatory Visit | Attending: Gastroenterology | Admitting: Gastroenterology

## 2016-11-17 DIAGNOSIS — R109 Unspecified abdominal pain: Secondary | ICD-10-CM | POA: Diagnosis not present

## 2016-11-17 DIAGNOSIS — R140 Abdominal distension (gaseous): Secondary | ICD-10-CM | POA: Diagnosis not present

## 2016-11-17 MED ORDER — IOPAMIDOL (ISOVUE-300) INJECTION 61%
100.0000 mL | Freq: Once | INTRAVENOUS | Status: AC | PRN
  Administered 2016-11-17: 100 mL via INTRAVENOUS

## 2016-11-24 ENCOUNTER — Ambulatory Visit: Payer: 59

## 2016-12-07 ENCOUNTER — Encounter: Payer: Self-pay | Admitting: Gastroenterology

## 2016-12-07 ENCOUNTER — Ambulatory Visit: Payer: Medicaid Other | Admitting: Gastroenterology

## 2016-12-07 ENCOUNTER — Ambulatory Visit (INDEPENDENT_AMBULATORY_CARE_PROVIDER_SITE_OTHER): Payer: Medicaid Other | Admitting: Gastroenterology

## 2016-12-07 VITALS — BP 92/62 | HR 80 | Temp 97.9°F | Ht 64.5 in | Wt 169.0 lb

## 2016-12-07 DIAGNOSIS — R140 Abdominal distension (gaseous): Secondary | ICD-10-CM

## 2016-12-07 NOTE — Progress Notes (Signed)
Jonathon Bellows MD, MRCP(U.K) [address]  [place]  [place], [place] [address]  Main: [phone]  Fax: [phone]   Primary Care Physician: Jodi Marble, MD  Primary Gastroenterologist:  Dr. Jonathon Bellows   Chief Complaint  Patient presents with  . Follow-up    HPI: Cynthia Davidson is a 31 y.o. female here for a follow up    Summary of history :  She was seen by Dr Danise Mina on 11/04/16 when she mentioned about sudden onset LLQ pain after sexual intercourse on may 2nd . On pelvic exam noted to have tenderness in the left adnexa , noted on TV ultrasound with a complex cyst in the left ovary , no free fluid. Impression was a possible left hemorrhagic cyst . Plan was for repeat ultrasound in 6-8 weeks .When she saw me on 11/10/16 she mentioned abdominal pain ongoing since her C section in 2013, feels bloated , passing foul smelling gas , more issues with gas and bloating than pain. She has a bowel movement every other or every two days. Denies passing hard stool .   Interval history   10/31/2016-  12/07/2016  Celiac serology and TSH - normal . Negative pregnancy test .  Ct scan of the abdomen - no acute process.   Did not try the LOW FODMAP diet , tried the miralax - did not like the taste. Denies any diarrhea.     Current Outpatient Prescriptions  Medication Sig Dispense Refill  . amphetamine-dextroamphetamine (ADDERALL) 20 MG tablet Take 20 mg by mouth 2 (two) times daily as needed.    . Azelastine HCl 0.15 % SOLN USE AS DIRECTED 1-2 SPRAY PER NARES DAILY  1  . betamethasone dipropionate (DIPROLENE) 0.05 % cream APPLY TWICE DAILY TO AFFECTED AREAS  2  . clonazePAM (KLONOPIN) 0.5 MG tablet Take 0.5 mg by mouth 2 (two) times daily as needed for anxiety.    . fluocinonide (LIDEX) 0.05 % external solution APPLY TO AFFECTED SCALP TWICE DAILY UNTIL CLEAR  3  . ibuprofen (ADVIL,MOTRIN) 600 MG tablet Take 1 tablet (600 mg total) by mouth every 6 (six) hours as needed for  pain. 30 tablet 0  . ketoconazole (NIZORAL) 2 % shampoo APPLY TO SCALP 3 X WEEK,LEAVE ON 5 MINUTES, RINSE WELL.  10  . montelukast (SINGULAIR) 10 MG tablet Take 10 mg by mouth every evening.  12  . polyethylene glycol powder (GLYCOLAX/MIRALAX) powder 17 grams daily 255 g 3  . RETIN-A 0.025 % cream APPLY A PEA SIZED AMOUNT TO DRY FACE NIGHTLY  3  . Vitamin D, Ergocalciferol, (DRISDOL) 50000 units CAPS capsule Take 50,000 Units by mouth every 7 (seven) days.     No current facility-administered medications for this visit.     Allergies as of 12/07/2016 - Review Complete 12/07/2016  Allergen Reaction Noted  . Doxycycline Swelling 05/25/2011  . Morphine Nausea Only 11/08/2013    ROS:  General: Negative for anorexia, weight loss, fever, chills, fatigue, weakness. ENT: Negative for hoarseness, difficulty swallowing , nasal congestion. CV: Negative for chest pain, angina, palpitations, dyspnea on exertion, peripheral edema.  Respiratory: Negative for dyspnea at rest, dyspnea on exertion, cough, sputum, wheezing.  GI: See history of present illness. GU:  Negative for dysuria, hematuria, urinary incontinence, urinary frequency, nocturnal urination.  Endo: Negative for unusual weight change.    Physical Examination:   BP 92/62 (BP Location: Left Arm, Patient Position: Sitting, Cuff Size: Large)   Pulse 80   Temp 97.9  F (36.6 C) (Oral)   Ht 5' 4.5" (1.638 m)   Wt 169 lb (76.7 kg)   LMP 11/10/2016 Comment:  neg preg test 11/10/16  BMI 28.56 kg/m   General: Well-nourished, well-developed in no acute distress.  Eyes: No icterus. Conjunctivae pink. Mouth: Oropharyngeal mucosa moist and pink , no lesions erythema or exudate. Lungs: Clear to auscultation bilaterally. Non-labored. Heart: Regular rate and rhythm, no murmurs rubs or gallops.  Abdomen: Bowel sounds are normal, nontender, nondistended, no hepatosplenomegaly or masses, no abdominal bruits or hernia , no rebound or guarding.     Extremities: No lower extremity edema. No clubbing or deformities. Neuro: Alert and oriented x 3.  Grossly intact. Skin: Warm and dry, no jaundice.   Psych: Alert and cooperative, normal mood and affect.   Imaging Studies: Ct Abdomen Pelvis W Contrast  Result Date: 11/17/2016 CLINICAL DATA:  Bloating with left lower quadrant pain EXAM: CT ABDOMEN AND PELVIS WITH CONTRAST TECHNIQUE: Multidetector CT imaging of the abdomen and pelvis was performed using the standard protocol following bolus administration of intravenous contrast. CONTRAST:  139mL ISOVUE-300 IOPAMIDOL (ISOVUE-300) INJECTION 61% COMPARISON:  None. FINDINGS: Lower chest: No acute abnormality. Hepatobiliary: No focal liver abnormality is seen. No gallstones, gallbladder wall thickening, or biliary dilatation. Pancreas: Unremarkable. No pancreatic ductal dilatation or surrounding inflammatory changes. Spleen: Normal in size without focal abnormality. Adrenals/Urinary Tract: Adrenal glands are unremarkable. Kidneys are normal, without renal calculi, focal lesion, or hydronephrosis. Bladder is unremarkable. Stomach/Bowel: Stomach is within normal limits. Appendix appears normal. No evidence of bowel wall thickening, distention, or inflammatory changes. Vascular/Lymphatic: No significant vascular findings are present. No enlarged abdominal or pelvic lymph nodes. Reproductive: Uterus and bilateral adnexa are unremarkable. Other: No free air.  Trace free fluid in the pelvis. Musculoskeletal: No acute or significant osseous findings. IMPRESSION: 1. No CT evidence for acute intra-abdominal or pelvic pathology. 2. Trace free fluid in the pelvis. Electronically Signed   By: Donavan Foil M.D.   On: 11/17/2016 15:57    Assessment and Plan:   Cynthia Davidson is a 31 y.o. y/o female here to follow up for bloating . Has not tried a LOW FODMAP diet , did discuss about treating empiricaly for SIBO vs testing which she is not keen on presently. She  will try the low fodmap diet and call if she needs further input.     Dr Jonathon Bellows  MD,MRCP [facility]) Follow up PRN

## 2016-12-16 ENCOUNTER — Other Ambulatory Visit: Payer: 59

## 2016-12-16 ENCOUNTER — Ambulatory Visit: Payer: 59 | Admitting: Certified Nurse Midwife

## 2016-12-17 ENCOUNTER — Telehealth: Payer: 59 | Admitting: Nurse Practitioner

## 2016-12-17 ENCOUNTER — Telehealth: Payer: 59 | Admitting: Family

## 2016-12-17 DIAGNOSIS — R059 Cough, unspecified: Secondary | ICD-10-CM

## 2016-12-17 DIAGNOSIS — J028 Acute pharyngitis due to other specified organisms: Secondary | ICD-10-CM | POA: Diagnosis not present

## 2016-12-17 DIAGNOSIS — R05 Cough: Secondary | ICD-10-CM | POA: Diagnosis not present

## 2016-12-17 DIAGNOSIS — B9689 Other specified bacterial agents as the cause of diseases classified elsewhere: Secondary | ICD-10-CM

## 2016-12-17 MED ORDER — BENZONATATE 100 MG PO CAPS: 100.0000 mg | ORAL_CAPSULE | Freq: Three times a day (TID) | ORAL | 0 refills | Status: DC | PRN

## 2016-12-17 MED ORDER — AZITHROMYCIN 250 MG PO TABS: ORAL_TABLET | ORAL | 0 refills | Status: DC

## 2016-12-17 NOTE — Progress Notes (Signed)
We are sorry that you are not feeling well.  Here is how we plan to help!  Based on what you have shared with me it looks like you have upper respiratory tract inflammation that has resulted in a significant cough.  Inflammation and infection in the upper respiratory tract is commonly called bronchitis and has four common causes:  Allergies, Viral Infections, Acid Reflux and Bacterial Infections.  Allergies, viruses and acid reflux are treated by controlling symptoms or eliminating the cause. An example might be a cough caused by taking certain blood pressure medications. You stop the cough by changing the medication. Another example might be a cough caused by acid reflux. Controlling the reflux helps control the cough.  Based on your presentation I believe you most likely have A cough due to a virus.  This is called viral bronchitis and is best treated by rest, plenty of fluids and control of the cough.  You may use Ibuprofen or Tylenol as directed to help your symptoms.     In addition you may use A non-prescription cough medication called Mucinex DM: take 2 tablets every 12 hours.  THE FACT THAT YOU DO NOT HAVE A FEVER AND IT HAS BEEN OVER A WEEK- TELLS ME THAT YOU PROBABLY DO NOT NEED AN ANTIBIOTIC. iT SOUL JUST RUNS IT COURSE AND SYMPTOMATIC TREATMENT SHOULD HELP.    USE OF BRONCHODILATOR ("RESCUE") INHALERS: There is a risk from using your bronchodilator too frequently.  The risk is that over-reliance on a medication which only relaxes the muscles surrounding the breathing tubes can reduce the effectiveness of medications prescribed to reduce swelling and congestion of the tubes themselves.  Although you feel brief relief from the bronchodilator inhaler, your asthma may actually be worsening with the tubes becoming more swollen and filled with mucus.  This can delay other crucial treatments, such as oral steroid medications. If you need to use a bronchodilator inhaler daily, several times per day,  you should discuss this with your provider.  There are probably better treatments that could be used to keep your asthma under control.     HOME CARE . Only take medications as instructed by your medical team. . Complete the entire course of an antibiotic. . Drink plenty of fluids and get plenty of rest. . Avoid close contacts especially the very young and the elderly . Cover your mouth if you cough or cough into your sleeve. . Always remember to wash your hands . A steam or ultrasonic humidifier can help congestion.   GET HELP RIGHT AWAY IF: . You develop worsening fever. . You become short of breath . You cough up blood. . Your symptoms persist after you have completed your treatment plan MAKE SURE YOU   Understand these instructions.  Will watch your condition.  Will get help right away if you are not doing well or get worse.  Your e-visit answers were reviewed by a board certified advanced clinical practitioner to complete your personal care plan.  Depending on the condition, your plan could have included both over the counter or prescription medications. If there is a problem please reply  once you have received a response from your provider. Your safety is important to [place].  If you have drug allergies check your prescription carefully.    You can use MyChart to ask questions about today's visit, request a non-urgent call back, or ask for a work or school excuse for 24 hours related to this e-Visit. If it has been  greater than 24 hours you will need to follow up with your provider, or enter a new e-Visit to address those concerns. You will get an e-mail in the next two days asking about your experience.  I hope that your e-visit has been valuable and will speed your recovery. Thank you for using e-visits.   

## 2016-12-17 NOTE — Progress Notes (Signed)
See other evisit

## 2016-12-17 NOTE — Progress Notes (Signed)
Thank you for the details you put in the comment boxes. Those details really help [place] take better care of you. Based on the additional information and lasting a week, and perhaps some limitations of our software in regard to asking and answering questions about your symptoms, there may have been a slight misunderstanding about everything that is happening with your current condition. Taking that into account, I have revised the treatment plan as below, with antibiotics.  We are sorry that you are not feeling well.  Here is how we plan to help!  Based on what you have shared with me it looks like you have upper respiratory tract inflammation that has resulted in a significant cough.  Inflammation and infection in the upper respiratory tract is commonly called bronchitis and has four common causes:  Allergies, Viral Infections, Acid Reflux and Bacterial Infections.  Allergies, viruses and acid reflux are treated by controlling symptoms or eliminating the cause. An example might be a cough caused by taking certain blood pressure medications. You stop the cough by changing the medication. Another example might be a cough caused by acid reflux. Controlling the reflux helps control the cough.  Based on your presentation I believe you most likely have A cough due to bacteria.  When patients have a fever and a productive cough with a change in color or increased sputum production, we are concerned about bacterial bronchitis.  If left untreated it can progress to pneumonia.  If your symptoms do not improve with your treatment plan it is important that you contact your provider.   I have prescribed Azithromyin 250 mg: two tables now and then one tablet daily for 4 additonal days    In addition you may use A non-prescription cough medication called Mucinex DM: take 2 tablets every 12 hours. and A prescription cough medication called Tessalon Perles 100mg . You may take 1-2 capsules every 8 hours as needed for your cough.  If you develop a cough, you may use that.    USE OF BRONCHODILATOR ("RESCUE") INHALERS: There is a risk from using your bronchodilator too frequently.  The risk is that over-reliance on a medication which only relaxes the muscles surrounding the breathing tubes can reduce the effectiveness of medications prescribed to reduce swelling and congestion of the tubes themselves.  Although you feel brief relief from the bronchodilator inhaler, your asthma may actually be worsening with the tubes becoming more swollen and filled with mucus.  This can delay other crucial treatments, such as oral steroid medications. If you need to use a bronchodilator inhaler daily, several times per day, you should discuss this with your provider.  There are probably better treatments that could be used to keep your asthma under control.     HOME CARE . Only take medications as instructed by your medical team. . Complete the entire course of an antibiotic. . Drink plenty of fluids and get plenty of rest. . Avoid close contacts especially the very young and the elderly . Cover your mouth if you cough or cough into your sleeve. . Always remember to wash your hands . A steam or ultrasonic humidifier can help congestion.   GET HELP RIGHT AWAY IF: . You develop worsening fever. . You become short of breath . You cough up blood. . Your symptoms persist after you have completed your treatment plan MAKE SURE YOU   Understand these instructions.  Will watch your condition.  Will get help right away if you are not doing well  or get worse.  Your e-visit answers were reviewed by a board certified advanced clinical practitioner to complete your personal care plan.  Depending on the condition, your plan could have included both over the counter or prescription medications. If there is a problem please reply  once you have received a response from your provider. Your safety is important to [place].  If you have drug allergies  check your prescription carefully.    You can use MyChart to ask questions about today's visit, request a non-urgent call back, or ask for a work or school excuse for 24 hours related to this e-Visit. If it has been greater than 24 hours you will need to follow up with your provider, or enter a new e-Visit to address those concerns. You will get an e-mail in the next two days asking about your experience.  I hope that your e-visit has been valuable and will speed your recovery. Thank you for using e-visits.

## 2016-12-17 NOTE — Addendum Note (Signed)
Addended by: Benjamine Mola on: 12/17/2016 01:38 PM   Modules accepted: Orders

## 2017-06-05 ENCOUNTER — Telehealth: Payer: Self-pay | Admitting: Family

## 2017-06-05 DIAGNOSIS — B009 Herpesviral infection, unspecified: Secondary | ICD-10-CM

## 2017-06-05 MED ORDER — VALACYCLOVIR HCL 1 G PO TABS: 2000.0000 mg | ORAL_TABLET | Freq: Two times a day (BID) | ORAL | 0 refills | Status: DC

## 2017-06-05 NOTE — Progress Notes (Signed)
We are sorry that you are not feeling well.  Here is how we plan to help!  Based on what you have shared with me it does look like you have a viral infection.    Most cold sores or fever blisters are small fluid filled blisters around the mouth caused by herpes simplex virus.  The most common strain of the virus causing cold sores is herpes simplex virus 1.  It can be spread by skin contact, sharing eating utensils, or even sharing towels.  Cold sores are contagious to other people until dry. (Approximately 5-7 days).  Wash your hands. You can spread the virus to your eyes through handling your contact lenses after touching the lesions.  Most people experience pain at the sight or tingling sensations in their lips that may begin before the ulcers erupt.  Herpes simplex is treatable but not curable.  It may lie dormant for a long time and then reappear due to stress or prolonged sun exposure.  Many patients have success in treating their cold sores with an over the counter topical called Abreva.  You may apply the cream up to 5 times daily (maximum 10 days) until healing occurs.  If you would like to use an oral antiviral medication to speed the healing of your cold sore, I have sent a prescription to your local pharmacy Valacyclovir 2 gm twice daily for 1 day    HOME CARE:   Wash your hands frequently.  Do not pick at or rub the sore.  Don't open the blisters.  Avoid kissing other people during this time.  Avoid sharing drinking glasses, eating utensils, or razors.  Do not handle contact lenses unless you have thoroughly washed your hands with soap and warm water!  Avoid oral sex during this time.  Herpes from sores on your mouth can spread to your partner's genital area.  Avoid contact with anyone who has eczema or a weakened immune system.  Cold sores are often triggered by exposure to intense sunlight, use a lip balm containing a sunscreen (SPF 30 or higher).  GET HELP RIGHT AWAY  IF:   Blisters look infected.  Blisters occur near or in the eye.  Symptoms last longer than 10 days.  Your symptoms become worse.  MAKE SURE YOU:   Understand these instructions.  Will watch your condition.  Will get help right away if you are not doing well or get worse.    Your e-visit answers were reviewed by a board certified advanced clinical practitioner to complete your personal care plan.  Depending upon the condition, your plan could have  Included both over the counter or prescription medications.    Please review your pharmacy choice.  Be sure that the pharmacy you have chosen is open so that you can pick up your prescription now.  If there is a problem you csn message your provider in MyChart to have the prescription routed to another pharmacy.    Your safety is important to us.  If you have drug allergies check our prescription carefully.  For the next 24 hours you can use MyChart to ask questions about today's visit, request a non-urgent call back, or ask for a work or school excuse from your e-visit provider.  You will get an email in the next two days asking about your experience.  I hope that your e-visit has been valuable and will speed your recovery.  

## 2017-07-11 ENCOUNTER — Encounter (INDEPENDENT_AMBULATORY_CARE_PROVIDER_SITE_OTHER): Payer: Self-pay | Admitting: Vascular Surgery

## 2017-07-11 ENCOUNTER — Encounter (INDEPENDENT_AMBULATORY_CARE_PROVIDER_SITE_OTHER): Payer: 59 | Admitting: Vascular Surgery

## 2017-07-20 ENCOUNTER — Encounter (INDEPENDENT_AMBULATORY_CARE_PROVIDER_SITE_OTHER): Payer: Self-pay | Admitting: Vascular Surgery

## 2017-09-11 ENCOUNTER — Other Ambulatory Visit: Payer: Self-pay

## 2017-09-11 DIAGNOSIS — Z5321 Procedure and treatment not carried out due to patient leaving prior to being seen by health care provider: Secondary | ICD-10-CM | POA: Insufficient documentation

## 2017-09-11 DIAGNOSIS — R21 Rash and other nonspecific skin eruption: Secondary | ICD-10-CM | POA: Insufficient documentation

## 2017-09-12 ENCOUNTER — Encounter (HOSPITAL_COMMUNITY): Payer: Self-pay | Admitting: Emergency Medicine

## 2017-09-12 ENCOUNTER — Emergency Department (HOSPITAL_COMMUNITY)
Admission: EM | Admit: 2017-09-12 | Discharge: 2017-09-12 | Disposition: A | Discharge status: Home / Self Care | Payer: Self-pay | Attending: Emergency Medicine | Admitting: Emergency Medicine

## 2017-09-12 MED ORDER — DIPHENHYDRAMINE HCL 25 MG PO CAPS
25.0000 mg | ORAL_CAPSULE | Freq: Once | ORAL | Status: AC
  Administered 2017-09-12: 25 mg via ORAL
  Filled 2017-09-12: qty 1

## 2017-09-12 NOTE — ED Notes (Signed)
At desk requesting to leave.  States I have to pick up my son so I can't wait.  Encourage to return as needed.

## 2017-09-12 NOTE — ED Triage Notes (Signed)
Reports having a chemical peel to the face on Thursday.  Redness noted to face and neck.  Also has bumps all over the body.  Taking benadryl and  Using hydrocortisone.  No relief from the itching.  No airway issues at this time.

## 2017-10-17 ENCOUNTER — Other Ambulatory Visit: Payer: Self-pay

## 2017-10-17 ENCOUNTER — Encounter: Payer: Self-pay | Admitting: Obstetrics and Gynecology

## 2017-10-17 ENCOUNTER — Ambulatory Visit (INDEPENDENT_AMBULATORY_CARE_PROVIDER_SITE_OTHER): Payer: BLUE CROSS/BLUE SHIELD | Admitting: Obstetrics and Gynecology

## 2017-10-17 ENCOUNTER — Telehealth: Payer: Self-pay

## 2017-10-17 VITALS — BP 110/68 | HR 76 | Ht 64.5 in | Wt 158.0 lb

## 2017-10-17 DIAGNOSIS — R5383 Other fatigue: Secondary | ICD-10-CM | POA: Diagnosis not present

## 2017-10-17 DIAGNOSIS — Z789 Other specified health status: Secondary | ICD-10-CM

## 2017-10-17 DIAGNOSIS — F329 Major depressive disorder, single episode, unspecified: Secondary | ICD-10-CM | POA: Diagnosis not present

## 2017-10-17 DIAGNOSIS — IMO0001 Reserved for inherently not codable concepts without codable children: Secondary | ICD-10-CM

## 2017-10-17 DIAGNOSIS — Z0000 Encounter for general adult medical examination without abnormal findings: Secondary | ICD-10-CM | POA: Diagnosis not present

## 2017-10-17 DIAGNOSIS — Z124 Encounter for screening for malignant neoplasm of cervix: Secondary | ICD-10-CM

## 2017-10-17 DIAGNOSIS — E349 Endocrine disorder, unspecified: Secondary | ICD-10-CM

## 2017-10-17 MED ORDER — NORETHINDRONE 0.35 MG PO TABS: 1.0000 | ORAL_TABLET | Freq: Every day | ORAL | 11 refills | Status: DC

## 2017-10-17 NOTE — Telephone Encounter (Signed)
Rx was sent in but sent to wrong pharmacy. I resent to right pharmacy. KJ CMA

## 2017-10-17 NOTE — Telephone Encounter (Signed)
Pt needs her [facility] rx sent to the pharmacy. Please call when it has been sent in thanks

## 2017-10-19 ENCOUNTER — Other Ambulatory Visit: Payer: Self-pay | Admitting: Internal Medicine

## 2017-10-19 DIAGNOSIS — M25512 Pain in left shoulder: Secondary | ICD-10-CM

## 2017-10-20 ENCOUNTER — Other Ambulatory Visit: Payer: Self-pay | Admitting: Internal Medicine

## 2017-10-20 DIAGNOSIS — S4980XA Other specified injuries of shoulder and upper arm, unspecified arm, initial encounter: Secondary | ICD-10-CM

## 2017-10-20 LAB — PAPIG, CTNGTV, HPV, RFX 16/18
Chlamydia, Nuc. Acid Amp: NEGATIVE
GONOCOCCUS, NUC. ACID AMP: NEGATIVE
HPV, HIGH-RISK: NEGATIVE
PAP Smear Comment: 0
TRICH VAG BY NAA: NEGATIVE

## 2017-10-23 LAB — CBC
HEMATOCRIT: 37.7 % (ref 34.0–46.6)
Hemoglobin: 12.9 g/dL (ref 11.1–15.9)
MCH: 30.6 pg (ref 26.6–33.0)
MCHC: 34.2 g/dL (ref 31.5–35.7)
MCV: 90 fL (ref 79–97)
Platelets: 214 10*3/uL (ref 150–379)
RBC: 4.21 x10E6/uL (ref 3.77–5.28)
RDW: 13 % (ref 12.3–15.4)
WBC: 6.7 10*3/uL (ref 3.4–10.8)

## 2017-10-23 LAB — BASIC METABOLIC PANEL
BUN / CREAT RATIO: 14 (ref 9–23)
BUN: 11 mg/dL (ref 6–20)
CALCIUM: 8.9 mg/dL (ref 8.7–10.2)
CO2: 22 mmol/L (ref 20–29)
Chloride: 106 mmol/L (ref 96–106)
Creatinine, Ser: 0.76 mg/dL (ref 0.57–1.00)
GFR, EST AFRICAN AMERICAN: 123 mL/min/{1.73_m2} (ref >59)
GFR, EST NON AFRICAN AMERICAN: 106 mL/min/{1.73_m2} (ref >59)
Glucose: 97 mg/dL (ref 65–99)
POTASSIUM: 3.8 mmol/L (ref 3.5–5.2)
Sodium: 143 mmol/L (ref 134–144)

## 2017-10-23 LAB — FSH/LH
FSH: 3.3 m[IU]/mL
LH: 2.6 m[IU]/mL

## 2017-10-23 LAB — VITAMIN B12: Vitamin B-12: 425 pg/mL (ref 232–1245)

## 2017-10-23 LAB — TSH: TSH: 1.31 u[IU]/mL (ref 0.450–4.500)

## 2017-10-23 LAB — VITAMIN D 1,25 DIHYDROXY
VITAMIN D 1, 25 (OH) TOTAL: 28 pg/mL
VITAMIN D3 1, 25 (OH): 28 pg/mL
Vitamin D2 1, 25 (OH)2: <10 pg/mL

## 2017-10-23 LAB — ESTRADIOL: Estradiol: 55.5 pg/mL

## 2017-10-23 NOTE — Progress Notes (Signed)
Normal labs, released to [facility]

## 2017-10-24 ENCOUNTER — Ambulatory Visit: Payer: BLUE CROSS/BLUE SHIELD

## 2017-10-24 ENCOUNTER — Encounter: Payer: Self-pay | Admitting: Obstetrics and Gynecology

## 2017-10-24 NOTE — Progress Notes (Signed)
Gynecology Annual Exam   PCP: Jodi Marble, MD  Chief Complaint:  Chief Complaint  Patient presents with  . Gynecologic Exam    History of Present Illness: Patient is a 32 y.o. [id number] presents for annual exam. The patient has no complaints today.   LMP: Patient's last menstrual period was 09/24/2017 (exact date). Average Interval: regular monthly Duration of flow: a few days Heavy Menses: no Clots: no Intermenstrual Bleeding: no Postcoital Bleeding: no Dysmenorrhea: no  The patient is sexually active. She currently uses none for contraception. She denies dyspareunia.  The patient does not perform self breast exams.  There is no notable family history of breast or ovarian cancer in her family.  The patient wears seatbelts: yes.   The patient has regular exercise: no.    The patient reports current symptoms of depression.    Review of Systems: ROS  Past Medical History:  Past Medical History:  Diagnosis Date  . ADD (attention deficit disorder)   . Anxiety attack   . Factor 5 Leiden mutation, heterozygous ([place]) 07/2016   hematology [place]  . Guttate psoriasis   . Mastitis, associated with childbirth 02/2012   required hospitalization and IV antibiotics  . Panic attack     Past Surgical History:  Past Surgical History:  Procedure Laterality Date  . BIOPSY BREAST Bilateral 2008  . CESAREAN SECTION  02/26/2012   active phase arrest (FTP)  . 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 SINUS IRRIGATION    . TONSILLECTOMY    . WISDOM TOOTH EXTRACTION      Gynecologic History:  Patient's last menstrual period was 09/24/2017 (exact date). Contraception: none Last Pap: Results were:   2018 NIL  Obstetric History: [id number]  Family History:  Family History  Problem Relation Age of Onset  . Stroke Maternal Uncle   . Factor V Leiden deficiency Maternal Uncle   . Stroke Paternal Aunt   . Factor V Leiden deficiency Paternal Aunt   . Stroke Maternal  Grandfather   . Melanoma Paternal Grandmother   . Cerebral aneurysm Paternal Grandmother   . Heart disease Paternal Grandfather     Social History:  Social History   Socioeconomic History  . Marital status: Single    Spouse name: Not on file  . Number of children: 1  . Years of education: Not on file  . Highest education level: Not on file  Occupational History  . Occupation: LPN    Comment: works at [employer]  . Financial resource strain: Not on file  . Food insecurity:    Worry: Not on file    Inability: Not on file  . Transportation needs:    Medical: Not on file    Non-medical: Not on file  Tobacco Use  . Smoking status: Never Smoker  . Smokeless tobacco: Never Used  Substance and Sexual Activity  . Alcohol use: Yes    Comment: occasional  . Drug use: No  . Sexual activity: Yes    Birth control/protection: Condom  Lifestyle  . Physical activity:    Days per week: Not on file    Minutes per session: Not on file  . Stress: Not on file  Relationships  . Social connections:    Talks on phone: Not on file    Gets together: Not on file    Attends religious service: Not on file    Active member of club or organization: Not on  file    Attends meetings of clubs or organizations: Not on file    Relationship status: Not on file  . Intimate partner violence:    Fear of current or ex partner: Not on file    Emotionally abused: Not on file    Physically abused: Not on file    Forced sexual activity: Not on file  Other Topics Concern  . Not on file  Social History Narrative  . Not on file    Allergies:  Allergies  Allergen Reactions  . Doxycycline Swelling  . Morphine Nausea Only    Medications: Prior to Admission medications   Medication Sig Start Date End Date Taking? Authorizing Provider  amphetamine-dextroamphetamine (ADDERALL) 20 MG tablet Take 20 mg by mouth 2 (two) times daily as needed.   Yes [provider]  Azelastine HCl  0.15 % SOLN USE AS DIRECTED 1-2 SPRAY PER NARES DAILY 08/02/16  Yes [provider]  clonazePAM (KLONOPIN) 0.5 MG tablet Take 0.5 mg by mouth 2 (two) times daily as needed for anxiety.   Yes [provider]  ketoconazole (NIZORAL) 2 % shampoo APPLY TO SCALP 3 X WEEK,LEAVE ON 5 MINUTES, RINSE WELL. 04/30/16  Yes [provider]  Multiple Vitamin (MULTI-VITAMINS) TABS Take by mouth.   Yes [provider]  RETIN-A 0.025 % cream APPLY A PEA SIZED AMOUNT TO DRY FACE NIGHTLY 04/30/16  Yes [provider]  valACYclovir (VALTREX) 1000 MG tablet Take 2 tablets (2,000 mg total) by mouth 2 (two) times daily. 06/05/17  Yes Dutch Quint B, FNP  Vitamin D, Ergocalciferol, (DRISDOL) 50000 units CAPS capsule Take 50,000 Units by mouth every 7 (seven) days.   Yes [provider]  norethindrone (MICRONOR,CAMILA,ERRIN) 0.35 MG tablet Take 1 tablet (0.35 mg total) by mouth daily. 10/17/17   Homero Fellers, MD    Physical Exam Vitals: Blood pressure 110/68, pulse 76, height 5' 4.5" (1.638 m), weight 158 lb (71.7 kg), last menstrual period 09/24/2017.  General: NAD HEENT: normocephalic, anicteric Thyroid: no enlargement, no palpable nodules Pulmonary: No increased work of breathing, CTAB Cardiovascular: RRR, distal pulses 2+ Breast: Breast symmetrical, no tenderness, no palpable nodules or masses, no skin or nipple retraction present, no nipple discharge.  No axillary or supraclavicular lymphadenopathy. Abdomen: NABS, soft, non-tender, non-distended.  Umbilicus without lesions.  No hepatomegaly, splenomegaly or masses palpable. No evidence of hernia  Genitourinary:  External: Normal external female genitalia.  Normal urethral meatus, normal Bartholin's and Skene's glands.    Vagina: Normal vaginal mucosa, no evidence of prolapse.    Cervix: Grossly normal in appearance, no bleeding  Uterus: Non-enlarged, mobile, normal contour.  No CMT  Adnexa: ovaries  non-enlarged, no adnexal masses  Rectal: deferred  Lymphatic: no evidence of inguinal lymphadenopathy Extremities: no edema, erythema, or tenderness Neurologic: Grossly intact Psychiatric: mood appropriate, affect full  Female chaperone present for pelvic and breast  portions of the physical exam    Assessment: 32 y.o. [id number] routine annual exam  Plan: Problem List Items Addressed This Visit      Other   Fatigue   Relevant Orders   B12 (Completed)   Vitamin D 1,25 dihydroxy (Completed)    Other Visit Diagnoses    Health care maintenance    -  Primary   Relevant Orders   CBC (Completed)   Basic Metabolic Panel (BMET) (Completed)   TSH (Completed)   Screening for cervical cancer       Relevant Orders   PapIG, CtNgTv, HPV,  rfx 16/18 (Completed)   Hormone imbalance       Relevant Orders   Estradiol (Completed)   FSH/LH (Completed)   Birth control          2) STI screening  wasoffered and accepted  2)  ASCCP guidelines and rational discussed.  Patient opts for every 1 year screening interval  3) Contraception - the patient is currently using  none.  She is happy with her current form of contraception and plans to continue  4) Routine healthcare maintenance including cholesterol, diabetes screening discussed Ordered today  5) Depression: patient declines a prescription today for antidepressants. She would like to avoid medication. She has seen Dr. Nicolasa Ducking before but has had trouble getting into their office. I called Dr. Nicolasa Ducking and it sounds like she has unpaid bills. I told the patient this and encouraged her to contact them so that the payments could be resolved and she could assume care again. The patient was also was given phone numbers of several therapists in town. Discussed cognitive behavioral therapy.   6) Hormone levels ordered at the patient's request. She is concerned that hormone imbalances are contributing to her mood fluctuations.    Return in about 1 year  (around 10/18/2018) for annual .  Over 60 minutes spent face to face providing counseling.   Adrian Prows MD  [facility], [facility] 10/24/2017, 11:01 PM

## 2017-10-29 ENCOUNTER — Other Ambulatory Visit: Payer: Self-pay

## 2018-01-11 ENCOUNTER — Ambulatory Visit: Payer: BLUE CROSS/BLUE SHIELD | Attending: Specialist

## 2018-01-11 DIAGNOSIS — G8929 Other chronic pain: Secondary | ICD-10-CM | POA: Diagnosis present

## 2018-01-11 DIAGNOSIS — M25512 Pain in left shoulder: Secondary | ICD-10-CM | POA: Insufficient documentation

## 2018-01-11 DIAGNOSIS — M7989 Other specified soft tissue disorders: Secondary | ICD-10-CM | POA: Diagnosis present

## 2018-01-11 NOTE — Patient Instructions (Signed)
Medbridge Access Code: [email or web address]    Shoulder W - External Rotation with Resistance  10x3 with 5 lb ankle weight L arm, pain free range

## 2018-01-11 NOTE — Therapy (Signed)
[facility] [address] [place], [address] Phone: [phone]   Fax:  [phone]  Physical Therapy Evaluation  Patient Details  Name: Cynthia Davidson MRN: [id number] Date of Birth: May 25, 1986 Referring Provider: Earnestine Leys, MD   Encounter Date: 01/11/2018  PT End of Session - 01/11/18 1653    Visit Number  1    Number of Visits  13    Date for PT Re-Evaluation  02/23/18    PT Start Time  6761    PT Stop Time  9509    PT Time Calculation (min)  61 min    Activity Tolerance  Patient tolerated treatment well;No increased pain    Behavior During Therapy  WFL for tasks assessed/performed       Past Medical History:  Diagnosis Date  . ADD (attention deficit disorder)   . Anxiety attack   . Factor 5 Leiden mutation, heterozygous ([place]) 07/2016   hematology [place]  . Guttate psoriasis   . Mastitis, associated with childbirth 02/2012   required hospitalization and IV antibiotics  . Panic attack     Past Surgical History:  Procedure Laterality Date  . BIOPSY BREAST Bilateral 2008  . CESAREAN SECTION  02/26/2012   active phase arrest (FTP)  . 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 SINUS IRRIGATION    . TONSILLECTOMY    . WISDOM TOOTH EXTRACTION      There were no vitals filed for this visit.   Subjective Assessment - 01/11/18 1657    Subjective  L shoulder pain: 4/10 currently, 8/10 at worst for the past 2 months.     Pertinent History  L shoulder pain. Pt was going to the gym 4x/week, lifting weights. Pain has been going on for about 6 months. Had cortisone injection which made it worse. Felt pain in her L lateral neck, and upper trap area. Unable to sleep on her L side.   Has not had PT for L shoulder before.  Pt is R hand dominant.   Meloxicam does not help.  One point pt could not lift her arm over her head.   Some days L shoulder feels like it is getting better, some days it feels like  it is getting worse.     Patient Stated Goals  Get back to her workout routine.     Currently in Pain?  Yes    Pain Score  4     Pain Location  Shoulder    Pain Orientation  Left    Pain Type  Chronic pain    Pain Onset  More than a month ago    Pain Frequency  Constant    Aggravating Factors   turning her steering wheel, donning and doffing shirts (feels posterior shoulder catch), L shoulder abduction with ER reproduces posterior shoulder catching.  Sleeping on her L side.     Pain Relieving Factors  rest         [facility] PT Assessment - 01/11/18 1707      Assessment   Medical Diagnosis  Pain in unspecified shoulder    Referring Provider  Earnestine Leys, MD    Onset Date/Surgical Date  12/14/17 date PT referral signed; chronic condition    Hand Dominance  Right    Prior Therapy  Had prior PT for back with good results. Has not yet had PT for shoulder.       Precautions   Precaution  Comments  No known precautions      Restrictions   Other Position/Activity Restrictions  No known restrictions      Prior Function   Vocation Requirements  full function      Observation/Other Assessments   Focus on Therapeutic Outcomes (FOTO)   shoulder FOTO 70    Quick DASH   31.8%      Posture/Postural Control   Posture Comments  protracted neck, bilaterally protracted shoulders       AROM   Right Shoulder Flexion  147 Degrees    Right Shoulder ABduction  166 Degrees    Right Shoulder Internal Rotation  65 Degrees 72 degrees AAROM    Right Shoulder External Rotation  110 Degrees    Left Shoulder Flexion  154 Degrees 164 AAROM    Left Shoulder ABduction  172 Degrees    Left Shoulder Internal Rotation  77 Degrees 85 degrees AAROM    Left Shoulder External Rotation  85 Degrees 96 degrees AAROM    Cervical Flexion  WFL    Cervical Extension  WFL     Cervical - Right Side Bend  WFL with L upper trap pulling     Cervical - Left Side Bend  WFL    Cervical - Right Rotation  WFL    Cervical -  Left Rotation  [facility]      Strength   Right Shoulder Flexion  4+/5    Right Shoulder ABduction  4+/5    Right Shoulder Internal Rotation  4+/5    Right Shoulder External Rotation  4+/5    Left Shoulder Flexion  4+/5    Left Shoulder ABduction  4+/5    Left Shoulder Internal Rotation  5/5    Left Shoulder External Rotation  4+/5      Palpation   Palpation comment  crepitus L anterior shoulder around coracoid process and acromion process with L shoulder extension.  TTP L scapular spine.       Special Tests   Other special tests  (-) empty can test. Posterior shoulder pain with P to A pressure to L shoulder with L shoulder abduction and ER.  (+) Neer's test. L posterior shoulder pulling with Michel Bickers test.                 Objective measurements completed on examination: See above findings.     Therapeutic exercise   L shoulder ER resisting yellow band a few times. Pain.   Then with manual axial distraction 10x. No pain   Then with 5 lbs at distal arm to promote axial distraction 10x2   Reviewed and given as part of her HEP. Pt demonstrated and verbalized understanding. Handout provided.    Try functional L shoulder IR next visit with 5 lb weigh at arm if appropriate.    Improved exercise technique, movement at target joints, use of target muscles after mod verbal, visual, tactile cues.    Patient is a 32 year old female who came to physical therapy secondary to L shoulder pain. She also presents with poor posture, TTP, scapular weakness, L anterior shoulder crepitus with arm movements, decreased glenohumeral control, and difficulty performing functional tasks such as donning and doffing shirts, as well as turning the steering wheel while driving secondary to L shoulder pain. Pt will benefit from skilled physical therapy services to address the aforementioned deficits.         PT Education - 01/11/18 1929    Education provided  Yes  Education Details  ther-ex,  HEP, plan of care    Person(s) Educated  Patient    Methods  Explanation;Demonstration;Tactile cues;Verbal cues;Handout    Comprehension  Verbalized understanding;Returned demonstration       PT Short Term Goals - 01/11/18 1946      PT SHORT TERM GOAL #1   Title  Patient will be independent with her HEP to help decrease L shoulder pain, and improve ability to don and doff shirts, drive, and sleep on her L side more comfortably.     Time  3    Period  Weeks    Status  New    Target Date  02/02/18        PT Long Term Goals - 01/11/18 1938      PT LONG TERM GOAL #1   Title  Patient will have a decrease in L shoulder pain to 4/10 or less at worst to promote ability to don and doff shirts, drive, and sleep on her L side more comfortably.     Baseline  8/10 L shoulder pain at worst for the past 2 months (01/11/2018)    Time  6    Period  Weeks    Status  New    Target Date  02/23/18      PT LONG TERM GOAL #2   Title  Patient will improve L shoulder ER muscle strength by 1/2 MMT grade to promote ability to use her L arm with less shoulder pain.     Baseline  4+/5 (01/11/2018)    Time  6    Period  Weeks    Status  New    Target Date  02/23/18      PT LONG TERM GOAL #3   Title  Patient will improve shoulder FOTO score by at least 8 points as a demonstration of improved function.     Baseline  Shoulder FOTO 70 (01/11/2018)    Time  6    Period  Weeks    Status  New    Target Date  02/23/18      PT LONG TERM GOAL #4   Title  Patient will improve her Quick Dash score by at least 10% as a demonstration of improved function.     Baseline  31.8% (01/11/2018)    Time  6    Period  Weeks    Status  New    Target Date  02/23/18             Plan - 01/11/18 1925    Clinical Impression Statement  Patient is a 32 year old female who came to physical therapy secondary to L shoulder pain. She also presents with poor posture, TTP, scapular weakness, L anterior shoulder crepitus with  arm movements, decreased glenohumeral control, and difficulty performing functional tasks such as donning and doffing shirts, as well as turning the steering wheel while driving secondary to L shoulder pain. Pt will benefit from skilled physical therapy services to address the aforementioned deficits.     History and Personal Factors relevant to plan of care:  Chronicity of condition, L shoulder pain, difficulty donning and doffing shirts, driving, sleeping on her L side    Clinical Presentation  Unstable    Clinical Presentation due to:  Pain seems like it gets better some days, other days, it feels like it is worse.     Clinical Decision Making  Moderate    Rehab Potential  Good    Clinical Impairments  Affecting Rehab Potential  (-) Chronicity of condition, pain. (+) young age, motivated    PT Frequency  2x / week    PT Duration  6 weeks    PT Treatment/Interventions  Therapeutic exercise;Therapeutic activities;Neuromuscular re-education;Patient/family education;Manual techniques;Dry needling;Aquatic Therapy;Electrical Stimulation;Iontophoresis 4mg /ml Dexamethasone;Ultrasound    PT Next Visit Plan  scapular, ER muscle strengthening, glenohumeral control, manual therapy, modalities PRN.     Consulted and Agree with Plan of Care  Patient       Patient will benefit from skilled therapeutic intervention in order to improve the following deficits and impairments:  Pain, Postural dysfunction, Improper body mechanics, Decreased strength  Visit Diagnosis: Chronic left shoulder pain - Plan: PT plan of care cert/re-cert  Other specified soft tissue disorders - Plan: PT plan of care cert/re-cert     Problem List Patient Active Problem List   Diagnosis Date Noted  . Guttate psoriasis 09/11/2016  . Previous cesarean section 09/11/2016  . ADD (attention deficit disorder) 09/11/2016  . Anxiety 09/11/2016  . Cervical adenopathy 08/20/2016  . Factor V Leiden mutation ([place]) 08/20/2016  . Chronic  tension headaches 04/24/2015  . 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 Difficulty sleeping 03/13/2015  . Fatigue 03/13/2015  . Chronic abdominal pain 11/08/2013   Joneen Boers PT, DPT  01/11/2018, 8:05 PM  [facility] [address] [place], [address] Phone: [phone]   Fax:  [phone]  Name: Cynthia Davidson MRN: [id number] Date of Birth: 1986-02-19

## 2018-01-13 ENCOUNTER — Telehealth: Payer: Self-pay

## 2018-01-13 NOTE — Telephone Encounter (Signed)
Pt states since starting her birthcontrol, she is having dark bleeding. She started a new pack on Sunday and it also started then and is lasting longer than normal. CB# [phone]

## 2018-01-13 NOTE — Telephone Encounter (Signed)
Please advise. Thank you

## 2018-01-16 NOTE — Telephone Encounter (Signed)
This is your patient.

## 2018-01-17 ENCOUNTER — Ambulatory Visit: Payer: BLUE CROSS/BLUE SHIELD

## 2018-01-17 ENCOUNTER — Telehealth: Payer: Self-pay

## 2018-01-17 NOTE — Telephone Encounter (Signed)
No show. Called patient and left a message pertaining to appointment and a reminder for the next follow up session. Return phone call requested. Phone number ([phone]) provided.   

## 2018-01-18 NOTE — Telephone Encounter (Signed)
Called patient, but she did not answer, could not leave voicemail message.

## 2018-01-23 ENCOUNTER — Ambulatory Visit: Payer: BLUE CROSS/BLUE SHIELD

## 2018-01-25 ENCOUNTER — Ambulatory Visit: Payer: BLUE CROSS/BLUE SHIELD

## 2018-01-25 ENCOUNTER — Telehealth: Payer: Self-pay

## 2018-01-25 NOTE — Telephone Encounter (Signed)
No show. Called patient who said that she forgot. Has been in the middle of moving from one home to another and also has been busy working 8-5 pm. Has been meaning to call but keeps forgetting. Will be able to make it to her next 2 follow up appointments. Pt wrote appointments down in her calendar.

## 2018-01-30 ENCOUNTER — Ambulatory Visit: Payer: BLUE CROSS/BLUE SHIELD | Attending: Specialist

## 2018-01-30 DIAGNOSIS — M7989 Other specified soft tissue disorders: Secondary | ICD-10-CM | POA: Insufficient documentation

## 2018-01-30 DIAGNOSIS — G8929 Other chronic pain: Secondary | ICD-10-CM

## 2018-01-30 DIAGNOSIS — M25512 Pain in left shoulder: Secondary | ICD-10-CM | POA: Insufficient documentation

## 2018-01-30 NOTE — Therapy (Signed)
[facility] [address] [place], [address] Phone: [phone]   Fax:  [phone]  Physical Therapy Treatment  Patient Details  Name: Cynthia Davidson MRN: [id number] Date of Birth: May 17, 1986 Referring Provider: Earnestine Leys, MD   Encounter Date: 01/30/2018  PT End of Session - 01/30/18 6967    Visit Number  2    Number of Visits  13    Date for PT Re-Evaluation  02/23/18    PT Start Time  8938    PT Stop Time  1840    PT Time Calculation (min)  51 min    Activity Tolerance  Patient tolerated treatment well;No increased pain    Behavior During Therapy  WFL for tasks assessed/performed       Past Medical History:  Diagnosis Date  . ADD (attention deficit disorder)   . Anxiety attack   . Factor 5 Leiden mutation, heterozygous ([place]) 07/2016   hematology [facility]  . Guttate psoriasis   . Mastitis, associated with childbirth 02/2012   required hospitalization and IV antibiotics  . Panic attack     Past Surgical History:  Procedure Laterality Date  . BIOPSY BREAST Bilateral 2008  . CESAREAN SECTION  02/26/2012   active phase arrest (FTP)  . 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 SINUS IRRIGATION    . TONSILLECTOMY    . WISDOM TOOTH EXTRACTION      There were no vitals filed for this visit.  Subjective Assessment - 01/30/18 1750    Subjective  L shoulder is ok. Better than what it was.  3/10 currently.  Has not been able to do the HEP because she is afraid of pain. L shoulder is fine until she does things with it and then it kills her.  Has also been doing a lot of moving.  Works 2 jobs.     Pertinent History  L shoulder pain. Pt was going to the gym 4x/week, lifting weights. Pain has been going on for about 6 months. Had cortisone injection which made it worse. Felt pain in her L lateral neck, and upper trap area. Unable to sleep on her L side.   Has not had PT for L shoulder before.  Pt is R  hand dominant.   Meloxicam does not help.  One point pt could not lift her arm over her head.   Some days L shoulder feels like it is getting better, some days it feels like it is getting worse.     Patient Stated Goals  Get back to her workout routine.     Currently in Pain?  Yes    Pain Score  3     Pain Onset  More than a month ago                               PT Education - 01/30/18 1813    Education provided  Yes    Education Details  ther-ex    [employer]) Educated  Patient    Methods  Explanation;Demonstration;Tactile cues;Verbal cues    Comprehension  Returned demonstration;Verbalized understanding       Objective   slight L scapular winging   Medbridge Access Code: [email or web address]     Manual therapy  Supine inferior glide L shoulder with arm in abduction grade 3- to 3  Supine inferior lateral  gldie L shoulder with arm  in flexion grade 3- to 3  Supine posterior glide L shoulder with arm in abduction grade 3- to 3     Therapeutic exercise   Supine L shoulder flexion 162 degrees with anterior lateral arm pulling   Wall push-up position  Scapular prtraction 10x  Then 10x5 seconds   Then 10x10 seconds to promote serratus anterior muscle activation and decrease L scapular winging   Supine L scapular protraction 10x5 seconds   Then with 1 lbs 10x5 seconds   Then with 2 lbs 10x5 seconds   Pt education on shoulder blade positioning, scapular weakness, improving joint mobility, and glenohumeral control with shoulder flexion.    Improved exercise technique, movement at target joints, use of target muscles after mod verbal, visual, tactile cues.    Worked on improving inferior lateral joint mobility, decreasing scapular winging and decreasing L upper trap tension to promote glenohumeral control with shoulder flexion. No L shoulder pain at end of session.       PT Short Term Goals - 01/11/18 1946      PT SHORT TERM GOAL #1   Title   Patient will be independent with her HEP to help decrease L shoulder pain, and improve ability to don and doff shirts, drive, and sleep on her L side more comfortably.     Time  3    Period  Weeks    Status  New    Target Date  02/02/18        PT Long Term Goals - 01/11/18 1938      PT LONG TERM GOAL #1   Title  Patient will have a decrease in L shoulder pain to 4/10 or less at worst to promote ability to don and doff shirts, drive, and sleep on her L side more comfortably.     Baseline  8/10 L shoulder pain at worst for the past 2 months (01/11/2018)    Time  6    Period  Weeks    Status  New    Target Date  02/23/18      PT LONG TERM GOAL #2   Title  Patient will improve L shoulder ER muscle strength by 1/2 MMT grade to promote ability to use her L arm with less shoulder pain.     Baseline  4+/5 (01/11/2018)    Time  6    Period  Weeks    Status  New    Target Date  02/23/18      PT LONG TERM GOAL #3   Title  Patient will improve shoulder FOTO score by at least 8 points as a demonstration of improved function.     Baseline  Shoulder FOTO 70 (01/11/2018)    Time  6    Period  Weeks    Status  New    Target Date  02/23/18      PT LONG TERM GOAL #4   Title  Patient will improve her Quick Dash score by at least 10% as a demonstration of improved function.     Baseline  31.8% (01/11/2018)    Time  6    Period  Weeks    Status  New    Target Date  02/23/18            Plan - 01/30/18 1813    Clinical Impression Statement  Worked on improving inferior lateral joint mobility, decreasing scapular winging and decreasing L upper trap tension to promote glenohumeral control with shoulder flexion. No L  shoulder pain at end of session.     Rehab Potential  Good    Clinical Impairments Affecting Rehab Potential  (-) Chronicity of condition, pain. (+) young age, motivated    PT Frequency  2x / week    PT Duration  6 weeks    PT Treatment/Interventions  Therapeutic  exercise;Therapeutic activities;Neuromuscular re-education;Patient/family education;Manual techniques;Dry needling;Aquatic Therapy;Electrical Stimulation;Iontophoresis 4mg /ml Dexamethasone;Ultrasound    PT Next Visit Plan  scapular, ER muscle strengthening, glenohumeral control, manual therapy, modalities PRN.     Consulted and Agree with Plan of Care  Patient       Patient will benefit from skilled therapeutic intervention in order to improve the following deficits and impairments:  Pain, Postural dysfunction, Improper body mechanics, Decreased strength  Visit Diagnosis: Chronic left shoulder pain  Other specified soft tissue disorders     Problem List Patient Active Problem List   Diagnosis Date Noted  . Guttate psoriasis 09/11/2016  . Previous cesarean section 09/11/2016  . ADD (attention deficit disorder) 09/11/2016  . Anxiety 09/11/2016  . Cervical adenopathy 08/20/2016  . Factor V Leiden mutation ([place]) 08/20/2016  . Chronic tension headaches 04/24/2015  . 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 Difficulty sleeping 03/13/2015  . Fatigue 03/13/2015  . Chronic abdominal pain 11/08/2013   Joneen Boers PT, DPT   01/30/2018, 6:46 PM  [facility] [address], [place], [address] Phone: [phone]   Fax:  [phone]  Name: Cynthia Davidson MRN: [id number] Date of Birth: 23-Feb-1986

## 2018-02-01 ENCOUNTER — Ambulatory Visit: Payer: BLUE CROSS/BLUE SHIELD

## 2018-02-01 DIAGNOSIS — M7989 Other specified soft tissue disorders: Secondary | ICD-10-CM

## 2018-02-01 DIAGNOSIS — M25512 Pain in left shoulder: Principal | ICD-10-CM

## 2018-02-01 DIAGNOSIS — G8929 Other chronic pain: Secondary | ICD-10-CM

## 2018-02-01 NOTE — Therapy (Signed)
[facility] [address] [place], [address] Phone: [phone]   Fax:  [phone]  Physical Therapy Treatment  Patient Details  Name: Cynthia Davidson MRN: [id number] Date of Birth: [birth year] Referring Provider: Earnestine Leys, MD   Encounter Date: 02/01/2018  PT End of Session - 02/01/18 1800    Visit Number  3    Number of Visits  13    Date for PT Re-Evaluation  02/23/18    PT Start Time  1800 pt arrived late    PT Stop Time  1847    PT Time Calculation (min)  47 min    Activity Tolerance  Patient tolerated treatment well;No increased pain    Behavior During Therapy  WFL for tasks assessed/performed       Past Medical History:  Diagnosis Date  . ADD (attention deficit disorder)   . Anxiety attack   . Factor 5 Leiden mutation, heterozygous ([place]) 07/2016   hematology [facility]  . Guttate psoriasis   . Mastitis, associated with childbirth 02/2012   required hospitalization and IV antibiotics  . Panic attack     Past Surgical History:  Procedure Laterality Date  . BIOPSY BREAST Bilateral 2008  . CESAREAN SECTION  02/26/2012   active phase arrest (FTP)  . 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 SINUS IRRIGATION    . TONSILLECTOMY    . WISDOM TOOTH EXTRACTION      There were no vitals filed for this visit.  Subjective Assessment - 02/01/18 1801    Subjective  L shoulder feels sore. Was really sore yesterday. Has a hard time shrugging her L shoulder yesterday. 5/10 L shoulder soreness currently.     Pertinent History  L shoulder pain. Pt was going to the gym 4x/week, lifting weights. Pain has been going on for about 6 months. Had cortisone injection which made it worse. Felt pain in her L lateral neck, and upper trap area. Unable to sleep on her L side.   Has not had PT for L shoulder before.  Pt is R hand dominant.   Meloxicam does not help.  One point pt could not lift her arm over her head.    Some days L shoulder feels like it is getting better, some days it feels like it is getting worse.     Patient Stated Goals  Get back to her workout routine.     Currently in Pain?  Yes    Pain Score  5     Pain Onset  More than a month ago                               PT Education - 02/01/18 1853    Education provided  Yes    Education Details  ther-ex, HEP    Person(s) Educated  Patient    Methods  Explanation;Demonstration;Tactile cues;Verbal cues    Comprehension  Returned demonstration;Verbalized understanding        Objective   slight L scapular winging   142 degrees L shoulder flexion with anterior shoulder soreness.    MedbridgeAccess Code: [email or web address]     Manual therapy  Supine inferior lateral  gldie L shoulder with arm in flexion grade 3- to 3  Supine inferior glide L shoulder with arm in abduction grade 3- to 3  Supine posterior glide L shoulder with arm in abduction grade  3- to 3  Supine STM L teres major muscle        Therapeutic exercise   Cervical AROM all planes with overpressure. Reproduction of L anterior shoulder burning sensation with overpressure to R cervical side bending   L Median nerve neural tension: reproduction of L shoulder symptoms, ease with wrist flexion L radial nerve neural tension: no symptoms L ulnar nerve tension: reproduction of L shoulder symptoms, eases with wrist flexion   Supine L median nerve flossing 10x3  Decreased shoulder pain. Reviewed and given as part of her HEP. Pt demonstrated and verbalized understanding.   Manually resisted L scapular depression 7x5 seconds. L upper trap burning sensation.  Manually resisted scapular retraction targeting the lower trap muscles, 10x5 seconds for 2 sets  155 degrees flexion with less pain shoulder pain     Improved exercise technique, movement at target joints, use of target muscles after mod verbal, visual, tactile cues.     Improved ability to perform L shoulder flexion with increased range and decreased discomfort following activation of her lower trap muscle and improving L median nerve glide. Continue working on decreasing L scapular winging, improving joint mobility, neural glide, and improving glenohumeral mechanics to promote ability to raise her arm with less pain.        PT Short Term Goals - 01/11/18 1946      PT SHORT TERM GOAL #1   Title  Patient will be independent with her HEP to help decrease L shoulder pain, and improve ability to don and doff shirts, drive, and sleep on her L side more comfortably.     Time  3    Period  Weeks    Status  New    Target Date  02/02/18        PT Long Term Goals - 01/11/18 1938      PT LONG TERM GOAL #1   Title  Patient will have a decrease in L shoulder pain to 4/10 or less at worst to promote ability to don and doff shirts, drive, and sleep on her L side more comfortably.     Baseline  8/10 L shoulder pain at worst for the past 2 months (01/11/2018)    Time  6    Period  Weeks    Status  New    Target Date  02/23/18      PT LONG TERM GOAL #2   Title  Patient will improve L shoulder ER muscle strength by 1/2 MMT grade to promote ability to use her L arm with less shoulder pain.     Baseline  4+/5 (01/11/2018)    Time  6    Period  Weeks    Status  New    Target Date  02/23/18      PT LONG TERM GOAL #3   Title  Patient will improve shoulder FOTO score by at least 8 points as a demonstration of improved function.     Baseline  Shoulder FOTO 70 (01/11/2018)    Time  6    Period  Weeks    Status  New    Target Date  02/23/18      PT LONG TERM GOAL #4   Title  Patient will improve her Quick Dash score by at least 10% as a demonstration of improved function.     Baseline  31.8% (01/11/2018)    Time  6    Period  Weeks    Status  New  Target Date  02/23/18            Plan - 02/01/18 1758    Clinical Impression Statement  Improved  ability to perform L shoulder flexion with increased range and decreased discomfort following activation of her lower trap muscle and improving L median nerve glide. Continue working on decreasing L scapular winging, improving joint mobility, neural glide, and improving glenohumeral mechanics to promote ability to raise her arm with less pain.     Rehab Potential  Good    Clinical Impairments Affecting Rehab Potential  (-) Chronicity of condition, pain. (+) young age, motivated    PT Frequency  2x / week    PT Duration  6 weeks    PT Treatment/Interventions  Therapeutic exercise;Therapeutic activities;Neuromuscular re-education;Patient/family education;Manual techniques;Dry needling;Aquatic Therapy;Electrical Stimulation;Iontophoresis 4mg /ml Dexamethasone;Ultrasound    PT Next Visit Plan  scapular, ER muscle strengthening, glenohumeral control, manual therapy, modalities PRN.     Consulted and Agree with Plan of Care  Patient       Patient will benefit from skilled therapeutic intervention in order to improve the following deficits and impairments:  Pain, Postural dysfunction, Improper body mechanics, Decreased strength  Visit Diagnosis: Chronic left shoulder pain  Other specified soft tissue disorders     Problem List Patient Active Problem List   Diagnosis Date Noted  . Guttate psoriasis 09/11/2016  . Previous cesarean section 09/11/2016  . ADD (attention deficit disorder) 09/11/2016  . Anxiety 09/11/2016  . Cervical adenopathy 08/20/2016  . Factor V Leiden mutation ([place]) 08/20/2016  . Chronic tension headaches 04/24/2015  . 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 Difficulty sleeping 03/13/2015  . Fatigue 03/13/2015  . Chronic abdominal pain 11/08/2013    Joneen Boers PT, DPT   02/01/2018, 7:02 PM  [facility] [address], [place], [address] Phone: [phone]   Fax:  [phone]  Name: Cynthia Davidson MRN: [id number] Date of Birth: 05/23/1986

## 2018-02-01 NOTE — Patient Instructions (Signed)
  Supine L median nerve flossing 10x3  Decreased shoulder pain. Reviewed and given as part of her HEP. Pt demonstrated and verbalized understanding.    Pt was recommended to hold off on the previous home exercises for now and just perform the neural flossing and see how her shoulder does the day after performing the flossing. Pt verbalized understanding.

## 2018-02-14 ENCOUNTER — Ambulatory Visit: Payer: BLUE CROSS/BLUE SHIELD

## 2018-02-14 DIAGNOSIS — G8929 Other chronic pain: Secondary | ICD-10-CM

## 2018-02-14 DIAGNOSIS — M25512 Pain in left shoulder: Principal | ICD-10-CM

## 2018-02-14 DIAGNOSIS — M7989 Other specified soft tissue disorders: Secondary | ICD-10-CM

## 2018-02-14 NOTE — Patient Instructions (Signed)
  Pt was recommended to maintain a comfortable scapular retraction position throughout the day and when raising her arm up. Pt was also recommended to perform chin tucks 10x3 daily to promote upper thoracic extension and promote posterior scapular tipping when moving her L arm. Pt demonstrated and verbalized understanding.

## 2018-02-14 NOTE — Therapy (Signed)
[facility] [address] [place], [address] Phone: [phone]   Fax:  [phone]  Physical Therapy Treatment  Patient Details  Name: Cynthia Davidson MRN: [id number] Date of Birth: [birth year] Referring Provider: Earnestine Leys, MD   Encounter Date: 02/14/2018  PT End of Session - 02/14/18 1754    Visit Number  4    Number of Visits  13    Date for PT Re-Evaluation  02/23/18    PT Start Time  8938   pt arrived late due to traffic   PT Stop Time  1849    PT Time Calculation (min)  55 min    Activity Tolerance  Patient tolerated treatment well;No increased pain    Behavior During Therapy  WFL for tasks assessed/performed       Past Medical History:  Diagnosis Date  . ADD (attention deficit disorder)   . Anxiety attack   . Factor 5 Leiden mutation, heterozygous ([place]) 07/2016   hematology [place] [place]  . Guttate psoriasis   . Mastitis, associated with childbirth 02/2012   required hospitalization and IV antibiotics  . Panic attack     Past Surgical History:  Procedure Laterality Date  . BIOPSY BREAST Bilateral 2008  . CESAREAN SECTION  02/26/2012   active phase arrest (FTP)  . 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 SINUS IRRIGATION    . TONSILLECTOMY    . WISDOM TOOTH EXTRACTION      There were no vitals filed for this visit.  Subjective Assessment - 02/14/18 1755    Subjective  L shoulder is sore. 3/10 currently, 4/10 with raising her L arm. L shoulder did better after last session. Was not sore for 2-3 days afterwards.     Pertinent History  L shoulder pain. Pt was going to the gym 4x/week, lifting weights. Pain has been going on for about 6 months. Had cortisone injection which made it worse. Felt pain in her L lateral neck, and upper trap area. Unable to sleep on her L side.   Has not had PT for L shoulder before.  Pt is R hand dominant.   Meloxicam does not help.  One point pt could not  lift her arm over her head.   Some days L shoulder feels like it is getting better, some days it feels like it is getting worse.     Patient Stated Goals  Get back to her workout routine.     Currently in Pain?  Yes    Pain Score  4     Pain Onset  More than a month ago                               PT Education - 02/14/18 1902    Education provided  Yes    Education Details  ther-ex, HEP    Person(s) Educated  Patient    Methods  Explanation;Demonstration;Tactile cues;Verbal cues    Comprehension  Returned demonstration;Verbalized understanding        Objective   slight L scapular winging  150 degrees L shoulder flexion with anterior shoulder soreness at start of session.    MedbridgeAccess Code: [email or web address]    Manual therapy  Supine posterior, inferior, lateral gldie L shoulder with arm in flexion grade 3  Decreased tightness  Supine STM L teres major muscle    Supine STM L subscapularis  muscle   Seated STM L pectoralis minor  Seated STM L levator scapulae   Seated caudal pressure L first rib area.   Seated STM L infraspinatus   Decreased L anterior shoulder pain with flexion; L upper trap/ lateral neck area tightness felt.   Seated A to P to L humeral head. Decreased anterior shoulder discomfort with shoulder flexion    Therapeutic exercise    Supine L median nerve flossing 10x3 pain free range              L scapular retraction with posterior tipping 15 seconds x 5  L shoulder flexion with scapular retraction 5x  Pt was recommended to maintain a comfortable scapular retraction position throughout the day and when raising her arm up. Pt was also recommended to perform chin tucks 10x3 daily to promote upper thoracic extension and promote posterior scapular tipping when moving her L arm. Pt demonstrated and verbalized understanding.    Improved exercise technique, movement at target joints, use of target muscles  after mod verbal, visual, tactile cues.    Continued working on improving posterior, inferior, and lateral L glenohumeral joint mobility, as well as decreasing muscle tension around her L shoulder to promote better glenohumeral movement. Decreased L anterior shoulder pain with treatment to promote posterior glide of L humeral head when raising her L arm up. Patient will benefit from continued skilled physical therapy services to decrease pain and improve ability to use her L UE to perform functional tasks.          PT Short Term Goals - 01/11/18 1946      PT SHORT TERM GOAL #1   Title  Patient will be independent with her HEP to help decrease L shoulder pain, and improve ability to don and doff shirts, drive, and sleep on her L side more comfortably.     Time  3    Period  Weeks    Status  New    Target Date  02/02/18        PT Long Term Goals - 01/11/18 1938      PT LONG TERM GOAL #1   Title  Patient will have a decrease in L shoulder pain to 4/10 or less at worst to promote ability to don and doff shirts, drive, and sleep on her L side more comfortably.     Baseline  8/10 L shoulder pain at worst for the past 2 months (01/11/2018)    Time  6    Period  Weeks    Status  New    Target Date  02/23/18      PT LONG TERM GOAL #2   Title  Patient will improve L shoulder ER muscle strength by 1/2 MMT grade to promote ability to use her L arm with less shoulder pain.     Baseline  4+/5 (01/11/2018)    Time  6    Period  Weeks    Status  New    Target Date  02/23/18      PT LONG TERM GOAL #3   Title  Patient will improve shoulder FOTO score by at least 8 points as a demonstration of improved function.     Baseline  Shoulder FOTO 70 (01/11/2018)    Time  6    Period  Weeks    Status  New    Target Date  02/23/18      PT LONG TERM GOAL #4   Title  Patient will improve  her Quick Dash score by at least 10% as a demonstration of improved function.     Baseline  31.8%  (01/11/2018)    Time  6    Period  Weeks    Status  New    Target Date  02/23/18            Plan - 02/14/18 1859    Clinical Impression Statement  Continued working on improving posterior, inferior, and lateral L glenohumeral joint mobility, as well as decreasing muscle tension around her L shoulder to promote better glenohumeral movement. Decreased L anterior shoulder pain with treatment to promote posterior glide of L humeral head when raising her L arm up. Patient will benefit from continued skilled physical therapy services to decrease pain and improve ability to use her L UE to perform functional tasks.     Rehab Potential  Good    Clinical Impairments Affecting Rehab Potential  (-) Chronicity of condition, pain. (+) young age, motivated    PT Frequency  2x / week    PT Duration  6 weeks    PT Treatment/Interventions  Therapeutic exercise;Therapeutic activities;Neuromuscular re-education;Patient/family education;Manual techniques;Dry needling;Aquatic Therapy;Electrical Stimulation;Iontophoresis 4mg /ml Dexamethasone;Ultrasound    PT Next Visit Plan  scapular, ER muscle strengthening, glenohumeral control, manual therapy, modalities PRN.     Consulted and Agree with Plan of Care  Patient       Patient will benefit from skilled therapeutic intervention in order to improve the following deficits and impairments:  Pain, Postural dysfunction, Improper body mechanics, Decreased strength  Visit Diagnosis: Chronic left shoulder pain  Other specified soft tissue disorders     Problem List Patient Active Problem List   Diagnosis Date Noted  . Guttate psoriasis 09/11/2016  . Previous cesarean section 09/11/2016  . ADD (attention deficit disorder) 09/11/2016  . Anxiety 09/11/2016  . Cervical adenopathy 08/20/2016  . Factor V Leiden mutation ([place]) 08/20/2016  . Chronic tension headaches 04/24/2015  . 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 Difficulty sleeping 03/13/2015  . Fatigue  03/13/2015  . Chronic abdominal pain 11/08/2013    Joneen Boers PT, DPT   02/14/2018, 7:03 PM  [facility] [address] [place], [address] Phone: [phone]   Fax:  [phone]  Name: Cynthia Davidson MRN: [id number] Date of Birth: January 20, 1986

## 2018-02-16 ENCOUNTER — Ambulatory Visit: Payer: BLUE CROSS/BLUE SHIELD

## 2018-02-16 DIAGNOSIS — M25512 Pain in left shoulder: Secondary | ICD-10-CM | POA: Diagnosis not present

## 2018-02-16 DIAGNOSIS — G8929 Other chronic pain: Secondary | ICD-10-CM

## 2018-02-16 DIAGNOSIS — M7989 Other specified soft tissue disorders: Secondary | ICD-10-CM

## 2018-02-16 NOTE — Therapy (Signed)
[facility] [address] [place], [address] Phone: [phone]   Fax:  [phone]  Physical Therapy Treatment  Patient Details  Name: Cynthia Davidson MRN: [id number] Date of Birth: 1985/08/28 Referring Provider: Earnestine Leys, MD   Encounter Date: 02/16/2018  PT End of Session - 02/16/18 1737    Visit Number  5    Number of Visits  13    Date for PT Re-Evaluation  02/23/18    PT Start Time  8563    PT Stop Time  1827    PT Time Calculation (min)  50 min    Activity Tolerance  Patient tolerated treatment well;No increased pain    Behavior During Therapy  WFL for tasks assessed/performed       Past Medical History:  Diagnosis Date  . ADD (attention deficit disorder)   . Anxiety attack   . Factor 5 Leiden mutation, heterozygous ([place]) 07/2016   hematology [facility]  . Guttate psoriasis   . Mastitis, associated with childbirth 02/2012   required hospitalization and IV antibiotics  . Panic attack     Past Surgical History:  Procedure Laterality Date  . BIOPSY BREAST Bilateral 2008  . CESAREAN SECTION  02/26/2012   active phase arrest (FTP)  . 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 SINUS IRRIGATION    . TONSILLECTOMY    . WISDOM TOOTH EXTRACTION      There were no vitals filed for this visit.  Subjective Assessment - 02/16/18 1739    Subjective  L shoulder hurts today. Does not know if its due to the rain. Sore. 5/10 currently. Was ok after last session. Does not know if she slept on it wrong. Was sore when she woke up. Currently sleeps on her couch until her bed comes in.  L shoulder was sore yesterday.  Was ok after she left Tuesday.     Pertinent History  L shoulder pain. Pt was going to the gym 4x/week, lifting weights. Pain has been going on for about 6 months. Had cortisone injection which made it worse. Felt pain in her L lateral neck, and upper trap area. Unable to sleep on her L side.    Has not had PT for L shoulder before.  Pt is R hand dominant.   Meloxicam does not help.  One point pt could not lift her arm over her head.   Some days L shoulder feels like it is getting better, some days it feels like it is getting worse.     Patient Stated Goals  Get back to her workout routine.     Currently in Pain?  Yes    Pain Score  5     Pain Onset  More than a month ago                               PT Education - 02/16/18 1757    Education provided  Yes    Education Details  ther-ex    [employer]) Educated  Patient    Methods  Explanation;Demonstration;Tactile cues;Verbal cues    Comprehension  Returned demonstration;Verbalized understanding       Objective   slight L scapular winging  Scapular retraction: decreases anterior shoulder pain, increases L upper trap area discomfort.    MedbridgeAccess Code: [email or web address]  Manual therapy  Supine inferior lateral glide L shoulder with arm in  flexion grade 3  Supine posterior glide L shoulder with arm in flexion grade 3  Supine inferior lateral glide L shoulder with arm in abduction grade 3- to 3  Supine STM to cervical paraspinal muscles   Decreased L upper trap pain    Therapeutic exercise   Supine manual L neural nerve flossing by PT 10x3  Slight decreased in shoulder pain   Supine L cervical side bend 10x3  Supine with head on deflated ball  Cervical nodding 1 minute x 3  Cervical rotation 1 minute x 3  Manually resisted scapular retraction targeting the lower trap muscles, 10x5 seconds for 3 sets   L shoulder feels better afterwards.   Try [place] E-stim to L rhomboid and lower trap next visit if appropriate.     Improved exercise technique, movement at target joints, use of target muscles after mod verbal, visual, tactile cues.    Performed joint mobilization to decrease posterior, inferior and lateral L glenohumeral joint stiffness. Decreased L posterior  shoulder/upper trap pain with treatment to decrease L median nerve neural tension, and posterior cervical paraspinal muscle tension. Decreased L shoulder pain with treatment to promote lower trap muscle activation. Pt tolerated session well without aggravation of symptoms. Pt will benefit from continued skilled physical therapy services to decrease pain and improve function.         PT Short Term Goals - 01/11/18 1946      PT SHORT TERM GOAL #1   Title  Patient will be independent with her HEP to help decrease L shoulder pain, and improve ability to don and doff shirts, drive, and sleep on her L side more comfortably.     Time  3    Period  Weeks    Status  New    Target Date  02/02/18        PT Long Term Goals - 01/11/18 1938      PT LONG TERM GOAL #1   Title  Patient will have a decrease in L shoulder pain to 4/10 or less at worst to promote ability to don and doff shirts, drive, and sleep on her L side more comfortably.     Baseline  8/10 L shoulder pain at worst for the past 2 months (01/11/2018)    Time  6    Period  Weeks    Status  New    Target Date  02/23/18      PT LONG TERM GOAL #2   Title  Patient will improve L shoulder ER muscle strength by 1/2 MMT grade to promote ability to use her L arm with less shoulder pain.     Baseline  4+/5 (01/11/2018)    Time  6    Period  Weeks    Status  New    Target Date  02/23/18      PT LONG TERM GOAL #3   Title  Patient will improve shoulder FOTO score by at least 8 points as a demonstration of improved function.     Baseline  Shoulder FOTO 70 (01/11/2018)    Time  6    Period  Weeks    Status  New    Target Date  02/23/18      PT LONG TERM GOAL #4   Title  Patient will improve her Quick Dash score by at least 10% as a demonstration of improved function.     Baseline  31.8% (01/11/2018)    Time  6    Period  Weeks    Status  New    Target Date  02/23/18            Plan - 02/16/18 1758    Clinical Impression  Statement  Performed joint mobilization to decrease posterior, inferior and lateral L glenohumeral joint stiffness. Decreased L posterior shoulder/upper trap pain with treatment to decrease L median nerve neural tension, and posterior cervical paraspinal muscle tension. Decreased L shoulder pain with treatment to promote lower trap muscle activation. Pt tolerated session well without aggravation of symptoms. Pt will benefit from continued skilled physical therapy services to decrease pain and improve function.      Rehab Potential  Good    Clinical Impairments Affecting Rehab Potential  (-) Chronicity of condition, pain. (+) young age, motivated    PT Frequency  2x / week    PT Duration  6 weeks    PT Treatment/Interventions  Therapeutic exercise;Therapeutic activities;Neuromuscular re-education;Patient/family education;Manual techniques;Dry needling;Aquatic Therapy;Electrical Stimulation;Iontophoresis 4mg /ml Dexamethasone;Ultrasound    PT Next Visit Plan  scapular, ER muscle strengthening, glenohumeral control, manual therapy, modalities PRN.     Consulted and Agree with Plan of Care  Patient       Patient will benefit from skilled therapeutic intervention in order to improve the following deficits and impairments:  Pain, Postural dysfunction, Improper body mechanics, Decreased strength  Visit Diagnosis: Chronic left shoulder pain  Other specified soft tissue disorders     Problem List Patient Active Problem List   Diagnosis Date Noted  . Guttate psoriasis 09/11/2016  . Previous cesarean section 09/11/2016  . ADD (attention deficit disorder) 09/11/2016  . Anxiety 09/11/2016  . Cervical adenopathy 08/20/2016  . Factor V Leiden mutation ([place]) 08/20/2016  . Chronic tension headaches 04/24/2015  . 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 Difficulty sleeping 03/13/2015  . Fatigue 03/13/2015  . Chronic abdominal pain 11/08/2013   Joneen Boers PT, DPT   02/16/2018, 6:41 PM  [facility] [address] [place], [address] Phone: [phone]   Fax:  [phone]  Name: Cynthia Davidson MRN: [id number] Date of Birth: 1986-04-25

## 2018-02-20 ENCOUNTER — Ambulatory Visit: Payer: BLUE CROSS/BLUE SHIELD

## 2018-02-20 DIAGNOSIS — M25512 Pain in left shoulder: Secondary | ICD-10-CM | POA: Diagnosis not present

## 2018-02-20 DIAGNOSIS — G8929 Other chronic pain: Secondary | ICD-10-CM

## 2018-02-20 DIAGNOSIS — M7989 Other specified soft tissue disorders: Secondary | ICD-10-CM

## 2018-02-20 NOTE — Patient Instructions (Signed)
Gave seated scapular depression isometrics against chair arm rest 10x3 with 5 second holds and L first rib stretch with strap and cervical rotation 10x3 each direction as part of her HEP. Pt demonstrated and verbalized understanding.

## 2018-02-20 NOTE — Therapy (Signed)
[facility] [address] [place], [address] Phone: [phone]   Fax:  [phone]  Physical Therapy Treatment  Patient Details  Name: Cynthia Davidson MRN: [id number] Date of Birth: Jan 21, 1986 Referring Provider: Earnestine Leys, MD   Encounter Date: 02/20/2018  PT End of Session - 02/20/18 1745    Visit Number  6    Number of Visits  13    Date for PT Re-Evaluation  02/23/18    PT Start Time  6546    PT Stop Time  1845    PT Time Calculation (min)  59 min    Activity Tolerance  Patient tolerated treatment well;No increased pain    Behavior During Therapy  WFL for tasks assessed/performed       Past Medical History:  Diagnosis Date  . ADD (attention deficit disorder)   . Anxiety attack   . Factor 5 Leiden mutation, heterozygous ([place]) 07/2016   hematology [facility]  . Guttate psoriasis   . Mastitis, associated with childbirth 02/2012   required hospitalization and IV antibiotics  . Panic attack     Past Surgical History:  Procedure Laterality Date  . BIOPSY BREAST Bilateral 2008  . CESAREAN SECTION  02/26/2012   active phase arrest (FTP)  . 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 SINUS IRRIGATION    . TONSILLECTOMY    . WISDOM TOOTH EXTRACTION      There were no vitals filed for this visit.  Subjective Assessment - 02/20/18 1747    Subjective  L shoulder feels ok.  L shoulder felt bad the day after last session.  2/10 L shoulder currently.  7/10 L shoulder at most for the past 7 days.     Pertinent History  L shoulder pain. Pt was going to the gym 4x/week, lifting weights. Pain has been going on for about 6 months. Had cortisone injection which made it worse. Felt pain in her L lateral neck, and upper trap area. Unable to sleep on her L side.   Has not had PT for L shoulder before.  Pt is R hand dominant.   Meloxicam does not help.  One point pt could not lift her arm over her head.   Some  days L shoulder feels like it is getting better, some days it feels like it is getting worse.     Patient Stated Goals  Get back to her workout routine.     Currently in Pain?  Yes    Pain Score  2     Pain Onset  More than a month ago         [facility] PT Assessment - 02/20/18 0001      Strength   Left Shoulder External Rotation  4+/5                           PT Education - 02/20/18 1840    Education provided  Yes    Education Details  ther-ex    Person(s) Educated  Patient    Methods  Explanation;Demonstration;Tactile cues;Verbal cues    Comprehension  Returned demonstration;Verbalized understanding        Objective   slight L scapular winging  Scapular retraction: decreases anterior shoulder pain, increases L upper trap area discomfort.    MedbridgeAccess Code: [email or web address]  Manual therapy  seated STM to L teres major muscle  Supine STM to cervical paraspinal  muscles               Supine STM L first rib distal scalene area  Seated STM to L first rib/scalene area   Decreased L UT area pain with shoulder flexion   Therapeutic exercise    Manually resisted scapular retraction targeting the lower trap muscles, 10x5 seconds for 3 sets  Seated press-ups on table 3x. Bilateral UT muscle burning sensation   Seated L scapular depression isometrics against arm rest, 10x3 with 5 second holds   Reviewed and given as part of her HEP. Pt demonstrated and verbalized understanding.   L shoulder flexion with L first rib stretch 10x2  L first rib stretch with strap and cervical rotation 10x3 each direction  Reviewed and given as part of her HEP. Pt demonstrated and verbalized understanding.    Try bent over or prone (with L arm hanging off table) scapular retractions next visit if appropriate. Try [place] E-stim to L rhomboid and lower trap next visit if appropriate.     Improved exercise technique, movement at target joints, use  of target muscles after min to mod verbal, visual, tactile cues.    Decreased L upper trap area pain with L shoulder flexion following treatment to decrease L scalene muscle tension and promote inferior glide of L first rib. Provided L first rib stretch as part of her HEP to help continue progress. Better able to perform L shoulder flexion as well as movement to doff shirt with L UE with less pain in supine compared to standing or sitting. Full L shoulder flexion AROM in supine but with L upper trap area pain and therefore worked predominantly in the neck and soft tissue around her L shoulder (decreasing L teres major and upper trap muscle tension) instead of joint mobilization. Minimal to no complain of L anterior shoulder pain during session, mainly L upper trap area tightness and discomfort. Pt tolerated session well without aggravation of symptoms.     PT Short Term Goals - 01/11/18 1946      PT SHORT TERM GOAL #1   Title  Patient will be independent with her HEP to help decrease L shoulder pain, and improve ability to don and doff shirts, drive, and sleep on her L side more comfortably.     Time  3    Period  Weeks    Status  New    Target Date  02/02/18        PT Long Term Goals - 01/11/18 1938      PT LONG TERM GOAL #1   Title  Patient will have a decrease in L shoulder pain to 4/10 or less at worst to promote ability to don and doff shirts, drive, and sleep on her L side more comfortably.     Baseline  8/10 L shoulder pain at worst for the past 2 months (01/11/2018)    Time  6    Period  Weeks    Status  New    Target Date  02/23/18      PT LONG TERM GOAL #2   Title  Patient will improve L shoulder ER muscle strength by 1/2 MMT grade to promote ability to use her L arm with less shoulder pain.     Baseline  4+/5 (01/11/2018)    Time  6    Period  Weeks    Status  New    Target Date  02/23/18      PT LONG TERM GOAL #  3   Title  Patient will improve shoulder FOTO score by at  least 8 points as a demonstration of improved function.     Baseline  Shoulder FOTO 70 (01/11/2018)    Time  6    Period  Weeks    Status  New    Target Date  02/23/18      PT LONG TERM GOAL #4   Title  Patient will improve her Quick Dash score by at least 10% as a demonstration of improved function.     Baseline  31.8% (01/11/2018)    Time  6    Period  Weeks    Status  New    Target Date  02/23/18            Plan - 02/20/18 1745    Clinical Impression Statement  Decreased L upper trap area pain with L shoulder flexion following treatment to decrease L scalene muscle tension and promote inferior glide of L first rib. Provided L first rib stretch as part of her HEP to help continue progress. Better able to perform L shoulder flexion as well as movement to doff shirt with L UE with less pain in supine compared to standing or sitting. Full L shoulder flexion AROM in supine but with L upper trap area pain and therefore worked predominantly in the neck and soft tissue around her L shoulder (decreasing L teres major and upper trap muscle tension) instead of joint mobilization. Minimal to no complain of L anterior shoulder pain during session, mainly L upper trap area tightness and discomfort. Pt tolerated session well without aggravation of symptoms.     Rehab Potential  Good    Clinical Impairments Affecting Rehab Potential  (-) Chronicity of condition, pain. (+) young age, motivated    PT Frequency  2x / week    PT Duration  6 weeks    PT Treatment/Interventions  Therapeutic exercise;Therapeutic activities;Neuromuscular re-education;Patient/family education;Manual techniques;Dry needling;Aquatic Therapy;Electrical Stimulation;Iontophoresis 4mg /ml Dexamethasone;Ultrasound    PT Next Visit Plan  scapular, ER muscle strengthening, glenohumeral control, manual therapy, modalities PRN.     Consulted and Agree with Plan of Care  Patient       Patient will benefit from skilled therapeutic  intervention in order to improve the following deficits and impairments:  Pain, Postural dysfunction, Improper body mechanics, Decreased strength  Visit Diagnosis: Chronic left shoulder pain  Other specified soft tissue disorders     Problem List Patient Active Problem List   Diagnosis Date Noted  . Guttate psoriasis 09/11/2016  . Previous cesarean section 09/11/2016  . ADD (attention deficit disorder) 09/11/2016  . Anxiety 09/11/2016  . Cervical adenopathy 08/20/2016  . Factor V Leiden mutation ([place]) 08/20/2016  . Chronic tension headaches 04/24/2015  . 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 Difficulty sleeping 03/13/2015  . Fatigue 03/13/2015  . Chronic abdominal pain 11/08/2013    Joneen Boers PT, DPT   02/20/2018, 7:07 PM  [facility] [address], [place], [address] Phone: [phone]   Fax:  [phone]  Name: Cynthia Davidson MRN: [id number] Date of Birth: [birth year]

## 2018-02-23 ENCOUNTER — Ambulatory Visit: Payer: BLUE CROSS/BLUE SHIELD

## 2018-02-23 DIAGNOSIS — M25512 Pain in left shoulder: Secondary | ICD-10-CM | POA: Diagnosis not present

## 2018-02-23 DIAGNOSIS — G8929 Other chronic pain: Secondary | ICD-10-CM

## 2018-02-23 DIAGNOSIS — M7989 Other specified soft tissue disorders: Secondary | ICD-10-CM

## 2018-02-23 NOTE — Therapy (Signed)
[facility] [address], [place], [address] Phone: [phone]   Fax:  [phone]  Physical Therapy Treatment  Patient Details  Name: Cynthia Davidson MRN: [id number] Date of Birth: [birth year] Referring Provider: Earnestine Leys, MD   Encounter Date: 02/23/2018  PT End of Session - 02/23/18 1738    Visit Number  7    Number of Visits  23    Date for PT Re-Evaluation  03/30/18    PT Start Time  3291    PT Stop Time  1826    PT Time Calculation (min)  48 min    Activity Tolerance  Patient tolerated treatment well;No increased pain    Behavior During Therapy  WFL for tasks assessed/performed       Past Medical History:  Diagnosis Date  . ADD (attention deficit disorder)   . Anxiety attack   . Factor 5 Leiden mutation, heterozygous ([place]) 07/2016   hematology [facility]  . Guttate psoriasis   . Mastitis, associated with childbirth 02/2012   required hospitalization and IV antibiotics  . Panic attack     Past Surgical History:  Procedure Laterality Date  . BIOPSY BREAST Bilateral 2008  . CESAREAN SECTION  02/26/2012   active phase arrest (FTP)  . 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 SINUS IRRIGATION    . TONSILLECTOMY    . WISDOM TOOTH EXTRACTION      There were no vitals filed for this visit.  Subjective Assessment - 02/23/18 1739    Subjective  L shoulder is doing ok, better than last time she was here. Feels like the problem in her L shoulder is from her L lateral neck to L lateral shoulder. Bothers her when she reaches across to her side (abduction).  3/10 currently, 5/10 at most for the past 7 days.  Shoulder was better after last session and the morning after. L shoulder got aggravated when unpacking stuff at home.     Pertinent History  L shoulder pain. Pt was going to the gym 4x/week, lifting weights. Pain has been going on for about 6 months. Had cortisone injection which made it worse.  Felt pain in her L lateral neck, and upper trap area. Unable to sleep on her L side.   Has not had PT for L shoulder before.  Pt is R hand dominant.   Meloxicam does not help.  One point pt could not lift her arm over her head.   Some days L shoulder feels like it is getting better, some days it feels like it is getting worse.     Patient Stated Goals  Get back to her workout routine.     Currently in Pain?  Yes    Pain Score  3     Pain Onset  More than a month ago         [facility] PT Assessment - 02/23/18 1744      Strength   Left Shoulder External Rotation  4/5                          Objective   slight L scapular winging  Scapular retraction: decreases anterior shoulder pain, increases L upper trap area discomfort.   MedbridgeAccess Code: [email or web address]  Therapeutic exercise   manually resisted L shoulder ER 1x  Manually resisted scapular retraction targeting the lower trap muscles, 10x5 seconds for3sets  Reviewed plan  of care: 2x/week for 5 weeks  Chin tucks 10x5 seconds  Seated L scapular depression isometrics against arm rest, 10x3 with 5 second holds   Feels more loose per pt.   L first rib stretch with strap and cervical rotation 10x3 each direction  prone (with L arm hanging off table) scapular retractions 4x. L scalene muscle tension felt.      Improved exercise technique, movement at target joints, use of target muscles after min to mod verbal, visual, tactile cues.   Manual therapy   Seated STM to L first rib/scalene area              seated STM to L teres major muscle  Supine STM to cervical paraspinal muscles     Slight decrease in L shoulder pain/upper trap area pain after manual therapy    Continued working on decreasing L scalene, teres major muscle tension and improving L lower trap muscle strength to promote ability to use her L arm with less discomfort. Pt demonstrates overall decreased in L  shoulder pain, and improved function based on her Quick Dash score since initial evaluation. Pt still demonstrates L shoulder pain, muscle tension around her L shoulder, decreased glenohumeral control, some joint stiffness, and difficulty performing functional tasks such as reaching to the side, as well as donning and doffing shirts due to pain and would benefit from continued skilled physical therapy services to address the aforementioned deficits.     PT Education - 02/23/18 1747    Education provided  Yes    Education Details  ther-ex    [employer]) Educated  Patient    Methods  Explanation;Demonstration;Tactile cues;Verbal cues    Comprehension  Returned demonstration;Verbalized understanding       PT Short Term Goals - 02/23/18 1830      PT SHORT TERM GOAL #1   Title  Patient will be independent with her HEP to help decrease L shoulder pain, and improve ability to don and doff shirts, drive, and sleep on her L side more comfortably.     Time  3    Period  Weeks    Status  On-going    Target Date  03/16/18        PT Long Term Goals - 02/23/18 1742      PT LONG TERM GOAL #1   Title  Patient will have a decrease in L shoulder pain to 4/10 or less at worst to promote ability to don and doff shirts, drive, and sleep on her L side more comfortably.     Baseline  8/10 L shoulder pain at worst for the past 2 months (01/11/2018); 5/10 at worst for the past 7 days (02/23/2018)    Time  5    Period  Weeks    Status  Partially Met    Target Date  03/30/18      PT LONG TERM GOAL #2   Title  Patient will improve L shoulder ER muscle strength by 1/2 MMT grade to promote ability to use her L arm with less shoulder pain.     Baseline  4+/5 (01/11/2018); 4/5 (02/23/2018)    Time  5    Period  Weeks    Status  On-going    Target Date  03/30/18      PT LONG TERM GOAL #3   Title  Patient will improve shoulder FOTO score by at least 8 points as a demonstration of improved function.     Baseline  Shoulder FOTO 70 (01/11/2018); 61 FOTO (02/23/2018)    Time  5    Period  Weeks    Status  On-going    Target Date  03/30/18      PT LONG TERM GOAL #4   Title  Patient will improve her Quick Dash score by at least 10% as a demonstration of improved function.     Baseline  31.8% (01/11/2018); 18.2% (02/23/2018)    Time  6    Period  Weeks    Status  Achieved            Plan - 02/23/18 1829    Clinical Impression Statement  Continued working on decreasing L scalene, teres major muscle tension and improving L lower trap muscle strength to promote ability to use her L arm with less discomfort. Pt demonstrates overall decreased in L shoulder pain, and improved function based on her Quick Dash score since initial evaluation. Pt still demonstrates L shoulder pain, muscle tension around her L shoulder, decreased glenohumeral control, some joint stiffness, and difficulty performing functional tasks such as reaching to the side, as well as donning and doffing shirts due to pain and would benefit from continued skilled physical therapy services to address the aforementioned deficits.     History and Personal Factors relevant to plan of care:  Chronicity of condition, L shoulder pain, difficulty donning and doffing shirts, driving, sleepin on her L side.     Clinical Presentation  Stable    Clinical Presentation due to:  decreased pain level at worst     Clinical Decision Making  Low    Rehab Potential  Good    Clinical Impairments Affecting Rehab Potential  (-) Chronicity of condition, pain. (+) young age, motivated    PT Frequency  2x / week    PT Duration  6 weeks    PT Treatment/Interventions  Therapeutic exercise;Therapeutic activities;Neuromuscular re-education;Patient/family education;Manual techniques;Dry needling;Aquatic Therapy;Electrical Stimulation;Iontophoresis 66m/ml Dexamethasone;Ultrasound    PT Next Visit Plan  scapular, ER muscle strengthening, glenohumeral control, manual therapy,  modalities PRN.     Consulted and Agree with Plan of Care  Patient       Patient will benefit from skilled therapeutic intervention in order to improve the following deficits and impairments:  Pain, Postural dysfunction, Improper body mechanics, Decreased strength  Visit Diagnosis: Chronic left shoulder pain - Plan: PT plan of care cert/re-cert  Other specified soft tissue disorders - Plan: PT plan of care cert/re-cert     Problem List Patient Active Problem List   Diagnosis Date Noted  . Guttate psoriasis 09/11/2016  . Previous cesarean section 09/11/2016  . ADD (attention deficit disorder) 09/11/2016  . Anxiety 09/11/2016  . Cervical adenopathy 08/20/2016  . Factor V Leiden [place] 08/20/2016  . Chronic tension headaches 04/24/2015  . 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 Difficulty sleeping 03/13/2015  . Fatigue 03/13/2015  . Chronic abdominal pain 11/08/2013   MJoneen BoersPT, DPT   02/23/2018, 6:51 PM  [facility] [address] [place] [address]Phone: [phone]  Fax:  [phone] Name: Cynthia WEDELMRN: [id number] of Birth: 909-Sep-1987

## 2018-02-23 NOTE — Patient Instructions (Signed)
Pt educated to perform L first rib stretch with cervical rotation 10x3, seated L scapular depression isometrics 10x3 with 5 seconds to arm rest on chair, and L medial nerve flossing for her HEP. Pt demonstrated and verbalized understanding.

## 2018-02-28 ENCOUNTER — Ambulatory Visit: Payer: BLUE CROSS/BLUE SHIELD | Attending: Specialist

## 2018-02-28 DIAGNOSIS — G8929 Other chronic pain: Secondary | ICD-10-CM | POA: Insufficient documentation

## 2018-02-28 DIAGNOSIS — M25512 Pain in left shoulder: Secondary | ICD-10-CM | POA: Insufficient documentation

## 2018-02-28 DIAGNOSIS — M7989 Other specified soft tissue disorders: Secondary | ICD-10-CM | POA: Insufficient documentation

## 2018-02-28 IMAGING — CT CT ABD-PELV W/ CM
2 of 4 series · 17 of 46 positions shown, 19 images · IV contrast (iopamidol)
Comparison: None.

CLINICAL DATA: Bloating with left lower quadrant pain

EXAM:
CT ABDOMEN AND PELVIS WITH CONTRAST
TECHNIQUE: Multidetector CT imaging of the abdomen and pelvis was performed
using the standard protocol following bolus administration of
intravenous contrast.
CONTRAST:  100mL 6MUI7W-M99 IOPAMIDOL ([id number]-M99) INJECTION 61%

[Series 2: axial soft tissue · axial · 0.61mm/px · z∈[-814,-444]mm · 14 of 82 slices shown, 16 images]
[im 4/82  soft-tissue]
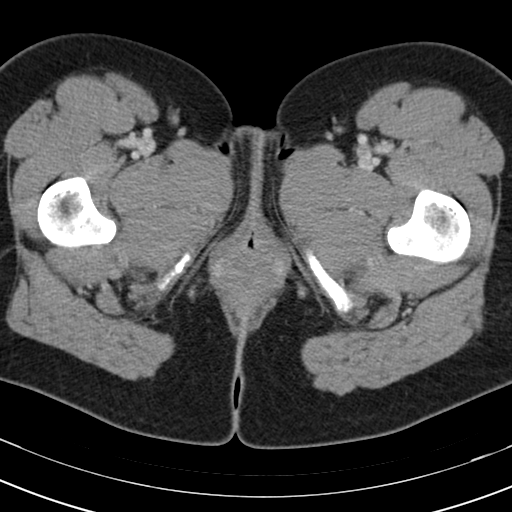
[im 4/82  bone]
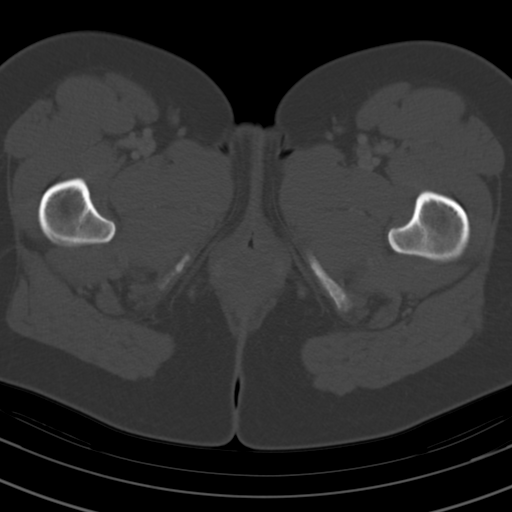
[im 11/82  soft-tissue]
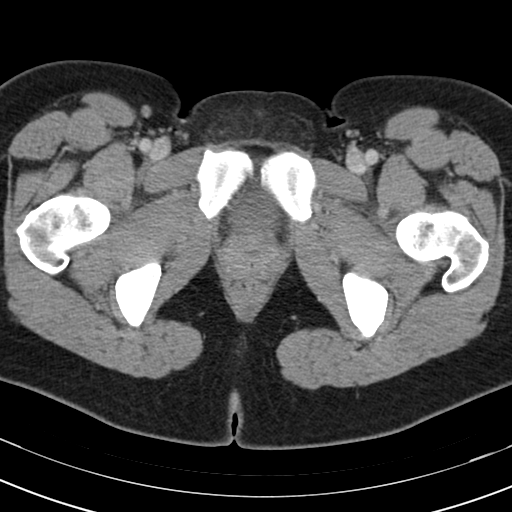
[im 17/82  soft-tissue]
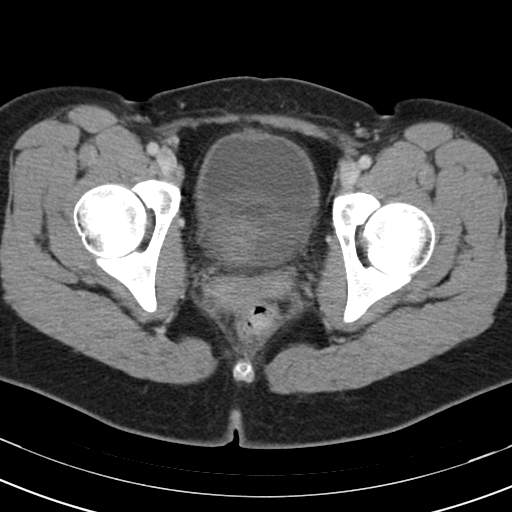
[im 21/82  soft-tissue]
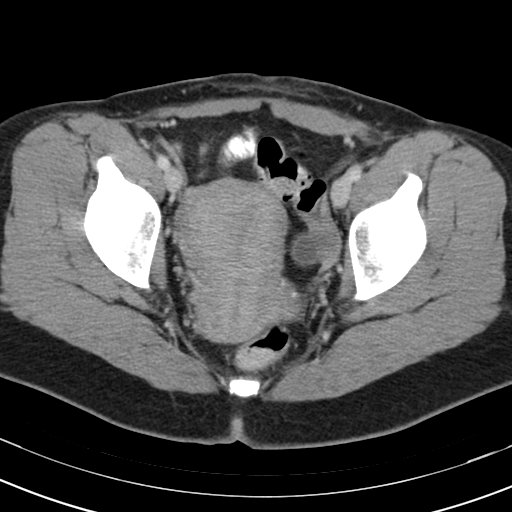
[im 28/82  soft-tissue]
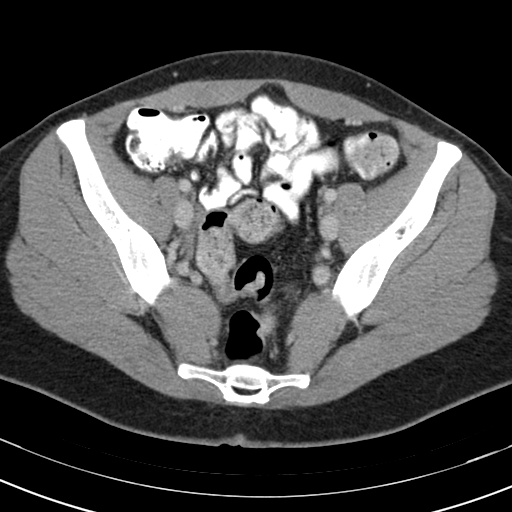
[im 34/82  soft-tissue]
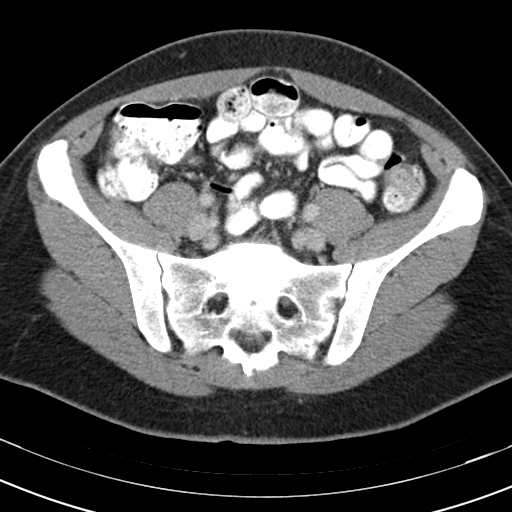
[im 38/82  soft-tissue]
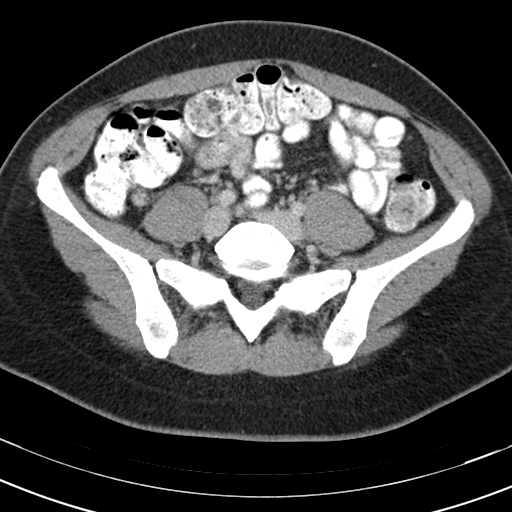
[im 44/82  soft-tissue]
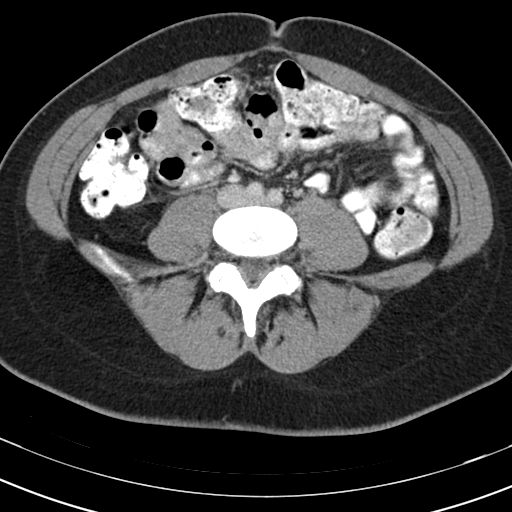
[im 48/82  soft-tissue]
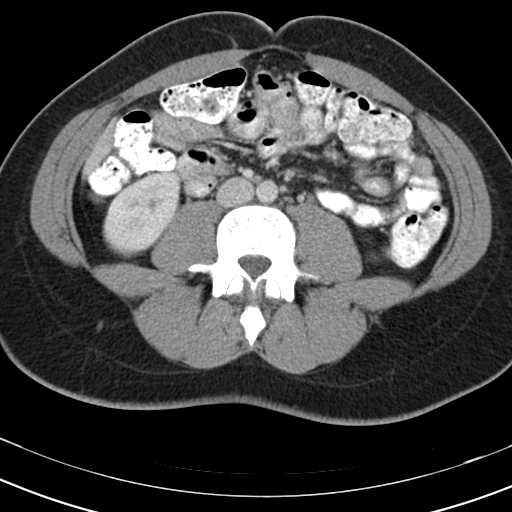
[im 48/82  bone]
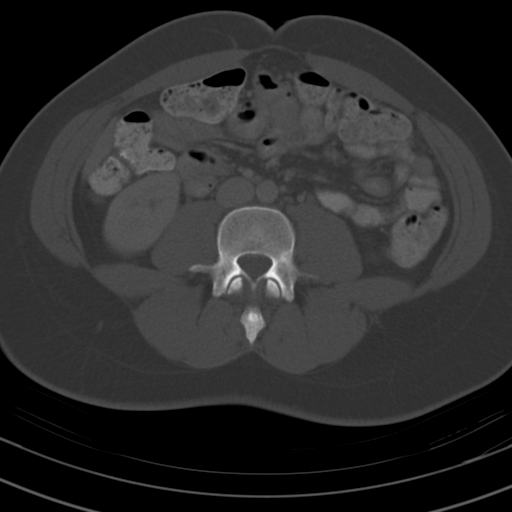
[im 55/82  soft-tissue]
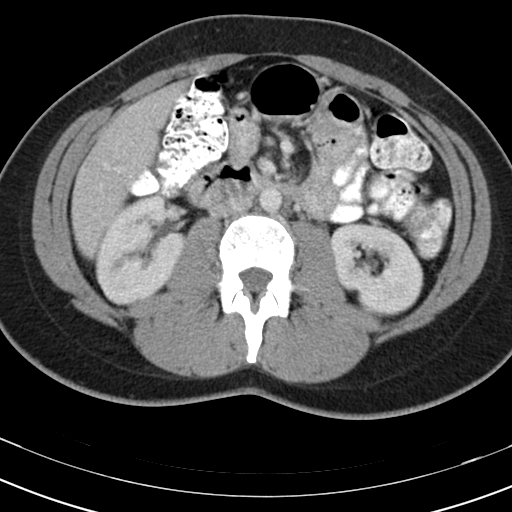
[im 61/82  soft-tissue]
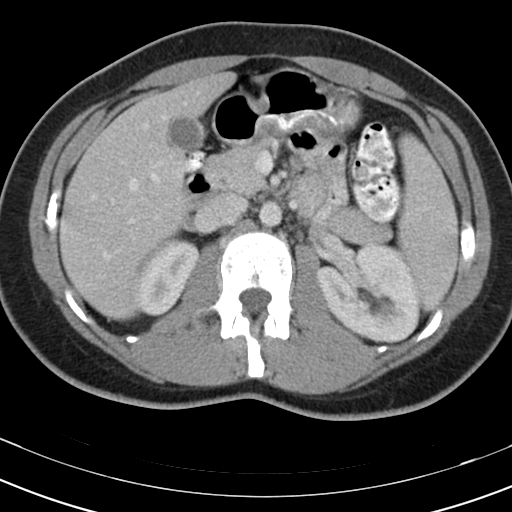
[im 65/82  soft-tissue]
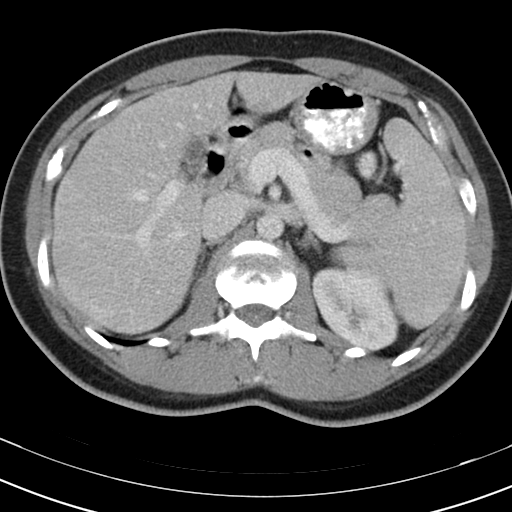
[im 71/82  soft-tissue]
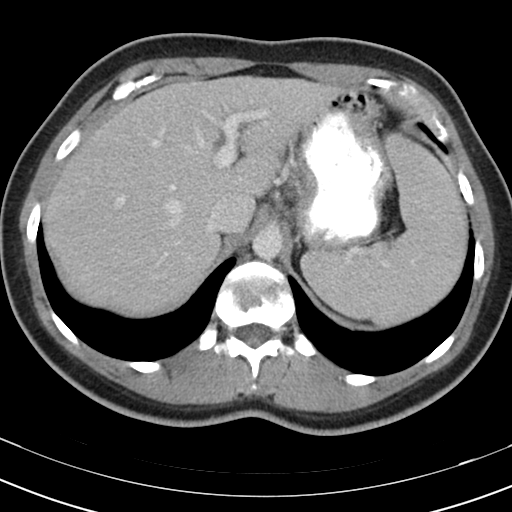
[im 78/82  soft-tissue]
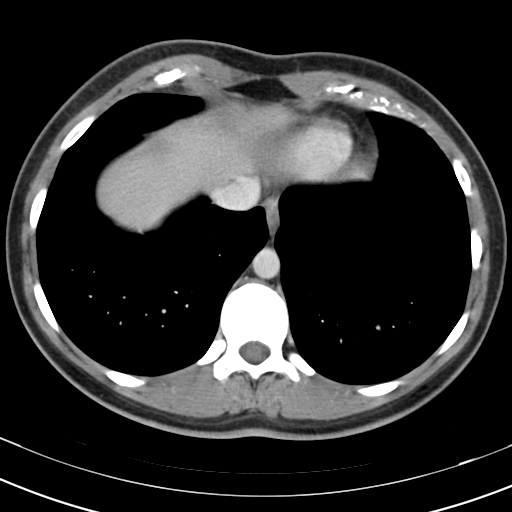

[Series 602: coronal · coronal · 0.79mm/px · 3 of 101 slices shown]
[im 34/101  soft-tissue]
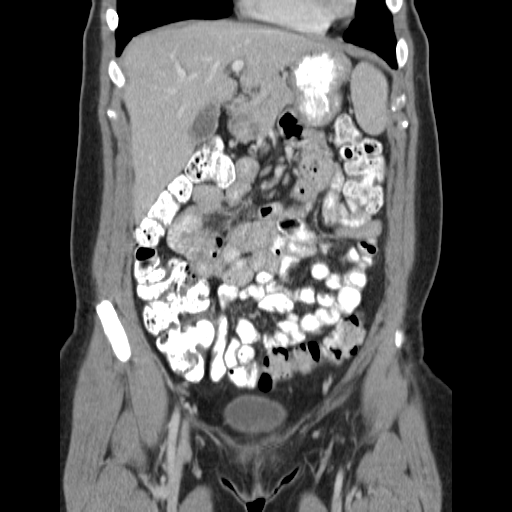
[im 45/101  soft-tissue]
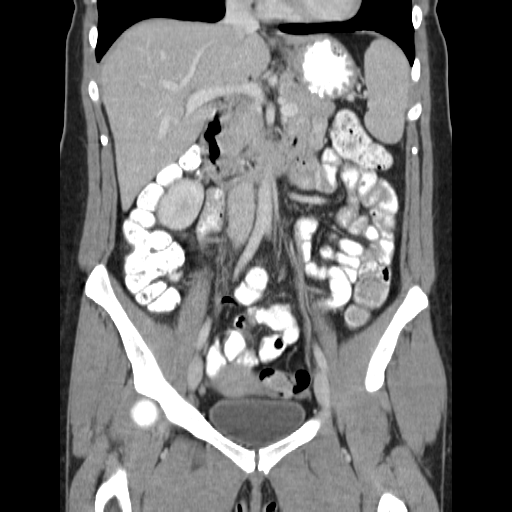
[im 56/101  soft-tissue]
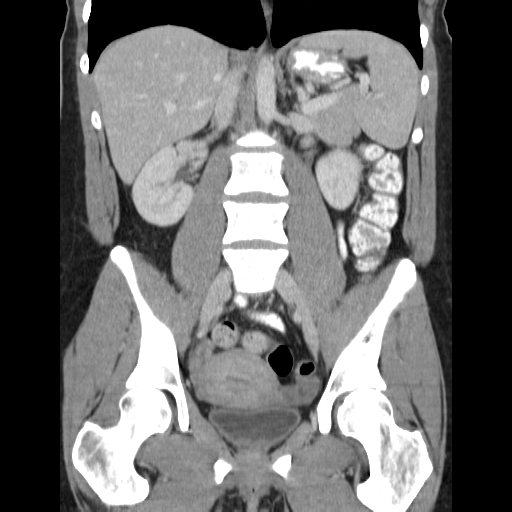

[17 of 46 positions shown; findings below may reference images not displayed]

FINDINGS: Lower chest: No acute abnormality.

Hepatobiliary: No focal liver abnormality is seen. No gallstones,
gallbladder wall thickening, or biliary dilatation.

Pancreas: Unremarkable. No pancreatic ductal dilatation or
surrounding inflammatory changes.

Spleen: Normal in size without focal abnormality.

Adrenals/Urinary Tract: Adrenal glands are unremarkable. Kidneys are
normal, without renal calculi, focal lesion, or hydronephrosis.
Bladder is unremarkable.

Stomach/Bowel: Stomach is within normal limits. Appendix appears
normal. No evidence of bowel wall thickening, distention, or
inflammatory changes.

Vascular/Lymphatic: No significant vascular findings are present. No
enlarged abdominal or pelvic lymph nodes.

Reproductive: Uterus and bilateral adnexa are unremarkable.

Other: No free air.  Trace free fluid in the pelvis.

Musculoskeletal: No acute or significant osseous findings.
IMPRESSION: 1. No CT evidence for acute intra-abdominal or pelvic pathology.
2. Trace free fluid in the pelvis.

## 2018-03-06 ENCOUNTER — Ambulatory Visit: Payer: BLUE CROSS/BLUE SHIELD

## 2018-03-06 DIAGNOSIS — M7989 Other specified soft tissue disorders: Secondary | ICD-10-CM | POA: Diagnosis present

## 2018-03-06 DIAGNOSIS — G8929 Other chronic pain: Secondary | ICD-10-CM | POA: Diagnosis present

## 2018-03-06 DIAGNOSIS — M25512 Pain in left shoulder: Principal | ICD-10-CM

## 2018-03-06 NOTE — Therapy (Signed)
[facility] [address] [place], [address] Phone: [phone]   Fax:  [phone]  Physical Therapy Treatment  Patient Details  Name: Cynthia Davidson MRN: [id number] Date of Birth: 1985/07/03 Referring Provider: Earnestine Leys, MD   Encounter Date: 03/06/2018  PT End of Session - 03/06/18 1746    Visit Number  8    Number of Visits  23    Date for PT Re-Evaluation  03/30/18    PT Start Time  0175    PT Stop Time  1843    PT Time Calculation (min)  57 min    Activity Tolerance  Patient tolerated treatment well;No increased pain    Behavior During Therapy  WFL for tasks assessed/performed       Past Medical History:  Diagnosis Date  . ADD (attention deficit disorder)   . Anxiety attack   . Factor 5 Leiden mutation, heterozygous ([place]) 07/2016   hematology [place]  . Guttate psoriasis   . Mastitis, associated with childbirth 02/2012   required hospitalization and IV antibiotics  . Panic attack     Past Surgical History:  Procedure Laterality Date  . BIOPSY BREAST Bilateral 2008  . CESAREAN SECTION  02/26/2012   active phase arrest (FTP)  . 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 SINUS IRRIGATION    . TONSILLECTOMY    . WISDOM TOOTH EXTRACTION      There were no vitals filed for this visit.  Subjective Assessment - 03/06/18 1747    Subjective  L shoulder has been really messed up. Better since the past 3 days. Her uncle worked on her muscle knots at her L upper trap which helped.  Also feels L anterior shoulder/pectoralis pain. Feels like the L upper trap pain is her primary pain, followed by her L anterior shoulder/distal pectoralis pain.  Woke up one day with shoulder pain around Saturday after the last session Thursday.  L shoulder was ok Friday.  5/10 L shoulder pain currently     Pertinent History  L shoulder pain. Pt was going to the gym 4x/week, lifting weights. Pain has been going on  for about 6 months. Had cortisone injection which made it worse. Felt pain in her L lateral neck, and upper trap area. Unable to sleep on her L side.   Has not had PT for L shoulder before.  Pt is R hand dominant.   Meloxicam does not help.  One point pt could not lift her arm over her head.   Some days L shoulder feels like it is getting better, some days it feels like it is getting worse.     Patient Stated Goals  Get back to her workout routine.     Currently in Pain?  Yes    Pain Score  5     Pain Onset  More than a month ago         [facility] PT Assessment - 03/06/18 1823      Observation/Other Assessments   Observations  (-) VBI, (-) alar ligament test                           PT Education - 03/06/18 1853    Education provided  Yes    Education Details  ther-ex, continue neural flossing, 1st rib stretch, L scapular depression HEP    Person(s) Educated  Patient  Methods  Explanation;Demonstration;Tactile cues;Verbal cues    Comprehension  Returned demonstration;Verbalized understanding        Objective   slight L scapular winging  Scapular retraction: decreases anterior shoulder pain, increases L upper trap area discomfort.   MedbridgeAccess Code: [email or web address]   Manual therapy   Seated STM to L first rib/scalene area   seated STM to L upper trap/ rhomboid muscle area   Decreased L anterior shoulder/chest pain with scapular retraction afterwards   Supine with L arm in abduction, posterior glide L humeral head grade 3 to decrease stiffness  Supine with L shoulder in flexion, posterior glide L humeral head grade 3 to decrease stiffness  Supine cervical distraction. Decreased L pectoralis pain with shoulder flexion in supine  Supine UPA to R C3,4,5 transverse processes to decrease stiffness.      Therapeutic exercise    L scapular depression isometrics with L forearm on arm rest 10x5 seconds   Manually resisted  scapular retraction targeting the lower trap muscles, 10x5 seconds for3sets   Improved exercise technique, movement at target joints, use of target muscles after min to mod verbal, visual, tactile cues.   Added manual cervical traction to decrease pressure to nerves L lateral neck. Decreased L anterior shoulder/pectoralis area pain after technique.  Continued working on decreasing tension to L scalene and upper trap area, improving L shoulder joint mobility, and L lower trap muscle activation. Decreased L shoulder pain to 3/10 after session.       PT Short Term Goals - 02/23/18 1830      PT SHORT TERM GOAL #1   Title  Patient will be independent with her HEP to help decrease L shoulder pain, and improve ability to don and doff shirts, drive, and sleep on her L side more comfortably.     Time  3    Period  Weeks    Status  On-going    Target Date  03/16/18        PT Long Term Goals - 02/23/18 1742      PT LONG TERM GOAL #1   Title  Patient will have a decrease in L shoulder pain to 4/10 or less at worst to promote ability to don and doff shirts, drive, and sleep on her L side more comfortably.     Baseline  8/10 L shoulder pain at worst for the past 2 months (01/11/2018); 5/10 at worst for the past 7 days (02/23/2018)    Time  5    Period  Weeks    Status  Partially Met    Target Date  03/30/18      PT LONG TERM GOAL #2   Title  Patient will improve L shoulder ER muscle strength by 1/2 MMT grade to promote ability to use her L arm with less shoulder pain.     Baseline  4+/5 (01/11/2018); 4/5 (02/23/2018)    Time  5    Period  Weeks    Status  On-going    Target Date  03/30/18      PT LONG TERM GOAL #3   Title  Patient will improve shoulder FOTO score by at least 8 points as a demonstration of improved function.     Baseline  Shoulder FOTO 70 (01/11/2018); 61 FOTO (02/23/2018)    Time  5    Period  Weeks    Status  On-going    Target Date  03/30/18      PT LONG TERM GOAL #4  Title  Patient will improve her Quick Dash score by at least 10% as a demonstration of improved function.     Baseline  31.8% (01/11/2018); 18.2% (02/23/2018)    Time  6    Period  Weeks    Status  Achieved            Plan - 03/06/18 1854    Clinical Impression Statement  Added manual cervical traction to decrease pressure to nerves L lateral neck. Decreased L anterior shoulder/pectoralis area pain after technique.  Continued working on decreasing tension to L scalene and upper trap area, improving L shoulder joint mobility, and L lower trap muscle activation. Decreased L shoulder pain to 3/10 after session.      Rehab Potential  Good    Clinical Impairments Affecting Rehab Potential  (-) Chronicity of condition, pain. (+) young age, motivated    PT Frequency  2x / week    PT Duration  6 weeks    PT Treatment/Interventions  Therapeutic exercise;Therapeutic activities;Neuromuscular re-education;Patient/family education;Manual techniques;Dry needling;Aquatic Therapy;Electrical Stimulation;Iontophoresis 41m/ml Dexamethasone;Ultrasound    PT Next Visit Plan  scapular, ER muscle strengthening, glenohumeral control, manual therapy, modalities PRN.     Consulted and Agree with Plan of Care  Patient       Patient will benefit from skilled therapeutic intervention in order to improve the following deficits and impairments:  Pain, Postural dysfunction, Improper body mechanics, Decreased strength  Visit Diagnosis: Chronic left shoulder pain  Other specified soft tissue disorders     Problem List Patient Active Problem List   Diagnosis Date Noted  . Guttate psoriasis 09/11/2016  . Previous cesarean section 09/11/2016  . ADD (attention deficit disorder) 09/11/2016  . Anxiety 09/11/2016  . Cervical adenopathy 08/20/2016  . [place] V Leiden [place] 08/20/2016  . Chronic tension headaches 04/24/2015  . 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 Difficulty sleeping 03/13/2015  .  Fatigue 03/13/2015  . Chronic abdominal pain 11/08/2013    MJoneen BoersPT, DPT   03/06/2018, 7:00 PM  [facility] [address] [place] [address]Phone: [phone]  Fax:  [phone] Name: BLILIBETH OPIEMRN: [id number] of Birth: 9Nov 29, 1987

## 2018-03-13 ENCOUNTER — Ambulatory Visit: Payer: BLUE CROSS/BLUE SHIELD

## 2018-03-15 ENCOUNTER — Ambulatory Visit: Payer: BLUE CROSS/BLUE SHIELD

## 2018-03-15 ENCOUNTER — Telehealth: Payer: Self-pay

## 2018-03-15 NOTE — Telephone Encounter (Signed)
No show. Called patient and left a message pertaining to appointment and a reminder for the next follow up session. Return phone call requested. Phone number ([phone]) provided.   

## 2018-03-20 ENCOUNTER — Ambulatory Visit: Payer: BLUE CROSS/BLUE SHIELD

## 2018-03-20 ENCOUNTER — Telehealth: Payer: Self-pay

## 2018-03-20 NOTE — Telephone Encounter (Signed)
Returned patient phone call. Pt states that her shoulder still hurts. 4/10 currently until she raises her arm. No L pectoralis pain today. Feels pain in her upper trap area.  Did ok for a couple of days after last session but pain returns 2 days later. Pt was recommended to try PT for a few more sessions. If there is no improvement, then it might be a good idea to return to her MD and possibly get more imaging done. Pt verbalized understanding.

## 2018-03-22 ENCOUNTER — Ambulatory Visit: Payer: BLUE CROSS/BLUE SHIELD

## 2018-03-22 DIAGNOSIS — M25512 Pain in left shoulder: Principal | ICD-10-CM

## 2018-03-22 DIAGNOSIS — G8929 Other chronic pain: Secondary | ICD-10-CM

## 2018-03-22 DIAGNOSIS — M7989 Other specified soft tissue disorders: Secondary | ICD-10-CM

## 2018-03-22 NOTE — Patient Instructions (Signed)
MedbridgeAccess Code: [email or web address]   Supine Deep Neck Flexor Nods  1-3 x 10 with 10 second holds

## 2018-03-22 NOTE — Therapy (Signed)
[facility] [address] [place], [address] Phone: [phone]   Fax:  [phone]  Physical Therapy Treatment  Patient Details  Name: Cynthia Davidson MRN: [id number] Date of Birth: 1986-03-31 Referring Provider: Earnestine Leys, MD   Encounter Date: 03/22/2018  PT End of Session - 03/22/18 1754    Visit Number  9    Number of Visits  23    Date for PT Re-Evaluation  03/30/18    PT Start Time  4920    PT Stop Time  1846    PT Time Calculation (min)  52 min    Activity Tolerance  Patient tolerated treatment well;No increased pain    Behavior During Therapy  WFL for tasks assessed/performed       Past Medical History:  Diagnosis Date  . ADD (attention deficit disorder)   . Anxiety attack   . Factor 5 Leiden mutation, heterozygous ([place]) 07/2016   hematology [place]  . Guttate psoriasis   . Mastitis, associated with childbirth 02/2012   required hospitalization and IV antibiotics  . Panic attack     Past Surgical History:  Procedure Laterality Date  . BIOPSY BREAST Bilateral 2008  . CESAREAN SECTION  02/26/2012   active phase arrest (FTP)  . 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 SINUS IRRIGATION    . TONSILLECTOMY    . WISDOM TOOTH EXTRACTION      There were no vitals filed for this visit.  Subjective Assessment - 03/22/18 1755    Subjective  L shoulder hurts. 6/10, mainly upper trap area but some L pectoralis area.     Pertinent History  L shoulder pain. Pt was going to the gym 4x/week, lifting weights. Pain has been going on for about 6 months. Had cortisone injection which made it worse. Felt pain in her L lateral neck, and upper trap area. Unable to sleep on her L side.   Has not had PT for L shoulder before.  Pt is R hand dominant.   Meloxicam does not help.  One point pt could not lift her arm over her head.   Some days L shoulder feels like it is getting better, some days it feels  like it is getting worse.     Patient Stated Goals  Get back to her workout routine.     Currently in Pain?  Yes    Pain Score  6     Pain Onset  More than a month ago                               PT Education - 03/22/18 1855    Education provided  Yes    Education Details  ther-ex, HEP    Person(s) Educated  Patient    Methods  Explanation;Demonstration;Tactile cues;Verbal cues;Handout    Comprehension  Returned demonstration;Verbalized understanding        Objective   slight L scapular winging  Scapular retraction: decreases anterior shoulder pain, increases L upper trap area discomfort.   MedbridgeAccess Code: [email or web address]  TTP L scapular spine   Manual therapy  supine cervical traction. Increased L scapular spine symptoms, eases with rest    Therapeutic exercise   L shoulder flexion with manual cervical compression. Less L shoulder pain  L arm motion as if doffing a shirt  With caudal pressure to cervical spine in sitting, no  L scapular spine pain  Then in standing, with caudal pressure to cervical spine. Slight L scapular spine pain   Seated cervical flexion isometrics 10x5 seconds with thumb under chin for 3 sets   Sitting with neutral neck posture  Gentle rhythmic stabilization to neck 30 seconds x 5   L cervical side bend 10x10 seconds  For 2 sets  Slight decreased L scapular discomfort with doffing shirt movement   Standing L medial nerve flossing 10x3. Increased symptoms.    Supine cervical nodding 10x10 seconds  Decreased L upper trap pulling sensation in supine      Improved exercise technique, movement at target joints, use of target muscles after mod verbal, visual, tactile cues.    Decreased L upper trap and scapular spine pain with gentle cervical compression and activation of anterior cervical muscles via supine cervical nodding suggesting cervical involvement with L shoulder pain. Provided supine  cervical nodding as part of her HEP. Decreased L shoulder pain to 4/10 after session.            PT Short Term Goals - 02/23/18 1830      PT SHORT TERM GOAL #1   Title  Patient will be independent with her HEP to help decrease L shoulder pain, and improve ability to don and doff shirts, drive, and sleep on her L side more comfortably.     Time  3    Period  Weeks    Status  On-going    Target Date  03/16/18        PT Long Term Goals - 02/23/18 1742      PT LONG TERM GOAL #1   Title  Patient will have a decrease in L shoulder pain to 4/10 or less at worst to promote ability to don and doff shirts, drive, and sleep on her L side more comfortably.     Baseline  8/10 L shoulder pain at worst for the past 2 months (01/11/2018); 5/10 at worst for the past 7 days (02/23/2018)    Time  5    Period  Weeks    Status  Partially Met    Target Date  03/30/18      PT LONG TERM GOAL #2   Title  Patient will improve L shoulder ER muscle strength by 1/2 MMT grade to promote ability to use her L arm with less shoulder pain.     Baseline  4+/5 (01/11/2018); 4/5 (02/23/2018)    Time  5    Period  Weeks    Status  On-going    Target Date  03/30/18      PT LONG TERM GOAL #3   Title  Patient will improve shoulder FOTO score by at least 8 points as a demonstration of improved function.     Baseline  Shoulder FOTO 70 (01/11/2018); 61 FOTO (02/23/2018)    Time  5    Period  Weeks    Status  On-going    Target Date  03/30/18      PT LONG TERM GOAL #4   Title  Patient will improve her Quick Dash score by at least 10% as a demonstration of improved function.     Baseline  31.8% (01/11/2018); 18.2% (02/23/2018)    Time  6    Period  Weeks    Status  Achieved            Plan - 03/22/18 1856    Clinical Impression Statement  Decreased L upper trap  and scapular spine pain with gentle cervical compression and activation of anterior cervical muscles via supine cervical nodding suggesting  cervical involvement with L shoulder pain. Provided supine cervical nodding as part of her HEP. Decreased L shoulder pain to 4/10 after session.     Rehab Potential  Good    Clinical Impairments Affecting Rehab Potential  (-) Chronicity of condition, pain. (+) young age, motivated    PT Frequency  2x / week    PT Duration  6 weeks    PT Treatment/Interventions  Therapeutic exercise;Therapeutic activities;Neuromuscular re-education;Patient/family education;Manual techniques;Dry needling;Aquatic Therapy;Electrical Stimulation;Iontophoresis 28m/ml Dexamethasone;Ultrasound    PT Next Visit Plan  scapular, ER muscle strengthening, glenohumeral control, manual therapy, modalities PRN.     Consulted and Agree with Plan of Care  Patient       Patient will benefit from skilled therapeutic intervention in order to improve the following deficits and impairments:  Pain, Postural dysfunction, Improper body mechanics, Decreased strength  Visit Diagnosis: Chronic left shoulder pain  Other specified soft tissue disorders     Problem List Patient Active Problem List   Diagnosis Date Noted  . Guttate psoriasis 09/11/2016  . Previous cesarean section 09/11/2016  . ADD (attention deficit disorder) 09/11/2016  . Anxiety 09/11/2016  . Cervical adenopathy 08/20/2016  . Factor V Leiden mutation ([place] 08/20/2016  . Chronic tension headaches 04/24/2015  . 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 Difficulty sleeping 03/13/2015  . Fatigue 03/13/2015  . Chronic abdominal pain 11/08/2013    MJoneen BoersPT, DPT   03/22/2018, 6:59 PM  [facility] [address] [place] [address]Phone: [phone]  Fax:  [phone] Name: BTHANVI BLINCOEMRN: [id number] of Birth: 912-May-1987

## 2018-03-27 ENCOUNTER — Ambulatory Visit: Payer: BLUE CROSS/BLUE SHIELD

## 2018-03-29 ENCOUNTER — Ambulatory Visit: Payer: BLUE CROSS/BLUE SHIELD

## 2018-04-04 ENCOUNTER — Ambulatory Visit: Payer: BLUE CROSS/BLUE SHIELD | Attending: Specialist

## 2018-04-04 DIAGNOSIS — G8929 Other chronic pain: Secondary | ICD-10-CM | POA: Insufficient documentation

## 2018-04-04 DIAGNOSIS — M7989 Other specified soft tissue disorders: Secondary | ICD-10-CM | POA: Diagnosis present

## 2018-04-04 DIAGNOSIS — M25512 Pain in left shoulder: Secondary | ICD-10-CM | POA: Diagnosis present

## 2018-04-04 NOTE — Patient Instructions (Signed)
Gave L cervical side bend 10x3 with 5 second holds as part of her HEP. Pt demonstrated and verbalized understanding.

## 2018-04-04 NOTE — Therapy (Signed)
[facility] [address], [place], [address] Phone: [phone]   Fax:  [phone]  Physical Therapy Treatment  Patient Details  Name: Cynthia Davidson MRN: [id number] Date of Birth: [birth year] Referring Provider (PT): Earnestine Leys, MD   Encounter Date: 04/04/2018  PT End of Session - 04/04/18 1753    Visit Number  10    Number of Visits  23    Date for PT Re-Evaluation  04/04/18    PT Start Time  7026    PT Stop Time  1857    PT Time Calculation (min)  64 min    Activity Tolerance  Patient tolerated treatment well;No increased pain    Behavior During Therapy  WFL for tasks assessed/performed       Past Medical History:  Diagnosis Date  . ADD (attention deficit disorder)   . Anxiety attack   . Factor 5 Leiden mutation, heterozygous ([place]) 07/2016   hematology [place]  . Guttate psoriasis   . Mastitis, associated with childbirth 02/2012   required hospitalization and IV antibiotics  . Panic attack     Past Surgical History:  Procedure Laterality Date  . BIOPSY BREAST Bilateral 2008  . CESAREAN SECTION  02/26/2012   active phase arrest (FTP)  . 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 SINUS IRRIGATION    . TONSILLECTOMY    . WISDOM TOOTH EXTRACTION      There were no vitals filed for this visit.  Subjective Assessment - 04/04/18 1755    Subjective  L shoulder bothers her today. 5/10 currently. 9/10 at most for the past 7 days.  Hurts when she shrugs her shoulders. Ice and heat helps.  L shoulder did good until last Friday and Saturday. Has gotten better since then.  Reaches a lot at work.  Feels a knot at her L upper trap. The neural flossing helps.  Pt wants to pause PT until her MRI.  Some days her shoulder feels better, other days, it does not. Does not know what flares up her shoulder.     Pertinent History  L shoulder pain. Pt was going to the gym 4x/week, lifting weights. Pain has been  going on for about 6 months. Had cortisone injection which made it worse. Felt pain in her L lateral neck, and upper trap area. Unable to sleep on her L side.   Has not had PT for L shoulder before.  Pt is R hand dominant.   Meloxicam does not help.  One point pt could not lift her arm over her head.   Some days L shoulder feels like it is getting better, some days it feels like it is getting worse.     Patient Stated Goals  Get back to her workout routine.     Currently in Pain?  Yes    Pain Score  5     Pain Onset  More than a month ago         [facility] PT Assessment - 04/04/18 1828      AROM   Cervical Flexion  WFL    Cervical Extension  Full with L upper trap stiffness sensation, with movement preference around C6/7 area.     Cervical - Right Side [facility] with L lateral neck pulling and pressure     Cervical - Left Side Bend  WFL    Cervical - Right Rotation  WFL with L upper  trap area pain    Cervical - Left Rotation  [facility]       Strength   Left Shoulder External Rotation  4+/5                           PT Education - 04/04/18 1905    Education provided  Yes    Education Details  ther-ex    [employer]) Educated  Patient    Methods  Explanation;Demonstration;Tactile cues;Verbal cues    Comprehension  Returned demonstration;Verbalized understanding      Objective   slight L scapular winging  Scapular retraction: decreases anterior shoulder pain, increases L upper trap area discomfort.   MedbridgeAccess Code: [email or web address]  Therapeutic exercise  L shoulder flexion 117 degrees with L UT pain  Seated manually resisted L scapular retraction targeting lower trap 10x5 seconds for 3 sets  Seated L scapular depression isometrics 10x3 with 5 second holds  Scapular retraction with L cervical side bend    Slight decrease in L lateral neck pain  Seated L cervical side bend 10x5 seconds   Give as part of HEP  Cervical flexion WFL Cervical  extension: full with L upper trap stiffness sensation with movement preference around C6/7 area R cervical side bend: WFL with L lateral neck pulling and pressure L cervical side bend: WFL R cervical rotation: WFL with L upper trap area pain L cervical rotation: [facility]  Seated thoracic extension over chair with chin tuck 10x5 seconds for 2 sets  Manually resisted L shoulder ER 1x  Improved exercise technique, movement at target joints, use of target muscles after min to mod verbal, visual, tactile cues.       Manual therapy  Seated manual cervical traction  seated STM to L upper trap/ rhomboid muscle area  Caudal glide to L first rib  Supine posterior glide to L shoulder in abduction, grade 3+     Pt demonstrates initial improvement in shoulder pain and function since starting PT. However, her L shoulder pain has increased for the past month. Pt would also mention that there would be days when her shoulder would feel good and other days when the pain would increase without known aggravating factors except for pt possibly picking up her son more when her shoulder would feel good. Challenges to progress include chronicity of condition and possible neck involvement. Skilled physical therapy services on hold for now after today's recert secondary to pt wanting to get an MRI for her shoulder and neck prior to resuming PT.      PT Short Term Goals - 02/23/18 1830      PT SHORT TERM GOAL #1   Title  Patient will be independent with her HEP to help decrease L shoulder pain, and improve ability to don and doff shirts, drive, and sleep on her L side more comfortably.     Time  3    Period  Weeks    Status  On-going    Target Date  03/16/18        PT Long Term Goals - 04/04/18 1917      PT LONG TERM GOAL #1   Title  Patient will have a decrease in L shoulder pain to 4/10 or less at worst to promote ability to don and doff shirts, drive, and sleep on her L side more  comfortably.     Baseline  8/10 L shoulder pain at worst for the past 2 months (01/11/2018); 5/10  at worst for the past 7 days (02/23/2018); 9/10 at worst for the past 7 days (04/04/2018)    Time  5    Period  Weeks    Status  On-going    Target Date  04/04/18      PT LONG TERM GOAL #2   Title  Patient will improve L shoulder ER muscle strength by 1/2 MMT grade to promote ability to use her L arm with less shoulder pain.     Baseline  4+/5 (01/11/2018); 4/5 (02/23/2018); 4+/5 (04/04/2018)    Time  5    Period  Weeks    Status  On-going    Target Date  03/28/18      PT LONG TERM GOAL #3   Title  Patient will improve shoulder FOTO score by at least 8 points as a demonstration of improved function.     Baseline  Shoulder FOTO 70 (01/11/2018); 61 FOTO (02/23/2018); 51 (04/04/2018)    Time  5    Period  Weeks    Status  On-going    Target Date  04/04/18      PT LONG TERM GOAL #4   Title  Patient will improve her Quick Dash score by at least 10% as a demonstration of improved function.     Baseline  31.8% (01/11/2018); 18.2% (02/23/2018); 50% (04/04/2018)    Time  6    Period  Weeks    Status  Not Met    Target Date  04/04/18            Plan - 04/04/18 1915    Clinical Impression Statement  Pt demonstrates initial improvement in shoulder pain and function since starting PT. However, her L shoulder pain has increased for the past month. Pt would also mention that there would be days when her shoulder would feel good and other days when the pain would increase without known aggravating factors except for pt possibly picking up her son more when her shoulder would feel good. Challenges to progress include chronicity of condition and possible neck involvement. Skilled physical therapy services on hold for now after today's recert secondary to pt wanting to get an MRI for her shoulder and neck prior to resuming PT.     History and Personal Factors relevant to plan of care:  Chronicity of condition,  L shoulder pain, difficulty donning and doffing shirts, driving, sleeping on her L side.     Clinical Presentation  Evolving    Clinical Presentation due to:  pain seems to be increasing    Clinical Decision Making  Moderate    Rehab Potential  Good    Clinical Impairments Affecting Rehab Potential  (-) Chronicity of condition, pain. (+) young age, motivated    PT Frequency  Other (comment)   today   PT Duration  --    PT Treatment/Interventions  Therapeutic exercise;Therapeutic activities;Neuromuscular re-education;Patient/family education;Manual techniques;Dry needling;Aquatic Therapy;Electrical Stimulation;Iontophoresis 4m/ml Dexamethasone;Ultrasound    PT Next Visit Plan  scapular, ER muscle strengthening, glenohumeral control, manual therapy, modalities PRN.     Consulted and Agree with Plan of Care  Patient       Patient will benefit from skilled therapeutic intervention in order to improve the following deficits and impairments:  Pain, Postural dysfunction, Improper body mechanics, Decreased strength  Visit Diagnosis: Chronic left shoulder pain - Plan: PT plan of care cert/re-cert  Other specified soft tissue disorders - Plan: PT plan of care cert/re-cert     Problem List Patient Active  Problem List   Diagnosis Date Noted  . Guttate psoriasis 09/11/2016  . Previous cesarean section 09/11/2016  . ADD (attention deficit disorder) 09/11/2016  . Anxiety 09/11/2016  . Cervical adenopathy 08/20/2016  . Factor V Leiden mutation ([place]) 08/20/2016  . Chronic tension headaches 04/24/2015  . 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 Difficulty sleeping 03/13/2015  . Fatigue 03/13/2015  . Chronic abdominal pain 11/08/2013   Joneen Boers PT, DPT   04/04/2018, 7:40 PM  [facility] [address], [place], [address] Phone: [phone]   Fax:  [phone]  Name: Cynthia Davidson MRN: [id number] Date of Birth:  1986-01-08

## 2018-04-10 ENCOUNTER — Ambulatory Visit: Payer: BLUE CROSS/BLUE SHIELD

## 2018-05-04 ENCOUNTER — Other Ambulatory Visit: Payer: Self-pay | Admitting: Internal Medicine

## 2018-05-04 DIAGNOSIS — S4980XA Other specified injuries of shoulder and upper arm, unspecified arm, initial encounter: Secondary | ICD-10-CM

## 2018-05-06 ENCOUNTER — Ambulatory Visit
Admission: RE | Admit: 2018-05-06 | Discharge: 2018-05-06 | Disposition: A | Discharge status: Home / Self Care | Payer: BLUE CROSS/BLUE SHIELD | Source: Ambulatory Visit | Attending: Internal Medicine | Admitting: Internal Medicine

## 2018-05-06 DIAGNOSIS — S4980XA Other specified injuries of shoulder and upper arm, unspecified arm, initial encounter: Secondary | ICD-10-CM

## 2018-05-24 ENCOUNTER — Telehealth: Payer: Self-pay

## 2018-05-24 NOTE — Telephone Encounter (Signed)
Please advise. Thank you

## 2018-05-24 NOTE — Telephone Encounter (Signed)
Pt calling to request order for mammogram.  She has never had one before.  Has had breast u/s and two biopsies which were both benign. She saw Dr. Geryl Rankins. Her breasts are sore a lot of the time.  [phone]

## 2018-05-24 NOTE — Telephone Encounter (Signed)
Okay to leave vm.

## 2018-05-25 NOTE — Telephone Encounter (Signed)
If there is a concern for a breast mass then I would have to see her first before ordering any studies. She can schedule an appointment

## 2018-05-29 NOTE — Telephone Encounter (Signed)
Called pt, she seemed confused about her needing to be seen again just to have a mammo, she stated there is no mass but she would like one just incase because she has had problems in the past and shes never had one done. 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 Please advise. Thank you

## 2018-05-29 NOTE — Telephone Encounter (Signed)
I would have to see her in clinic to address any breast related concerns and potentially order a mammogram.

## 2018-10-24 ENCOUNTER — Other Ambulatory Visit: Payer: Self-pay | Admitting: Obstetrics and Gynecology

## 2019-08-08 NOTE — Progress Notes (Signed)
PCP:  Jodi Marble, MD   Chief Complaint  Patient presents with  . Gynecologic Exam     HPI:      [name] is a 34 y.o. [phone] who LMP was Patient's last menstrual period was 07/26/2019 (approximate)., presents today for her annual examination.  Her menses are regular every 28-30 days, lasting 2-5 days.  Dysmenorrhea mild. She does not have intermenstrual bleeding.  Sex activity: not currently active - oral progesterone-only contraceptive. Hx of Factor V Leiden mutation. Last Pap: October 17, 2017  Results were: no abnormalities /neg HPV DNA   There is no FH of breast cancer. There is no FH of ovarian cancer. The patient does not do self-breast exams. 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 Doesn't like them. Hx of 2 benign breast bx in the past. No breast masses.   Tobacco use: The patient denies current or previous tobacco use. Alcohol use: none No drug use.  Exercise: moderately active  She does get adequate calcium and Vitamin D in her diet.   Past Medical History:  Diagnosis Date  . ADD (attention deficit disorder)   . Anxiety attack   . Factor 5 Leiden mutation, heterozygous ([place]) 07/2016   hematology [place]  . Guttate psoriasis   . Mastitis, associated with childbirth 02/2012   required hospitalization and IV antibiotics  . Panic attack     Past Surgical History:  Procedure Laterality Date  . BIOPSY BREAST Bilateral 2008  . CESAREAN SECTION  02/26/2012   active phase arrest (FTP)  . 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 SINUS IRRIGATION    . TONSILLECTOMY    . WISDOM TOOTH EXTRACTION      Family History  Problem Relation Age of Onset  . Stroke Maternal Uncle   . Factor V Leiden deficiency Maternal Uncle   . Stroke Paternal Aunt   . Factor V Leiden deficiency Paternal Aunt   . Stroke Maternal Grandfather   . Melanoma Paternal Grandmother   . Cerebral aneurysm  Paternal Grandmother   . Heart disease Paternal Grandfather     Social History   Socioeconomic History  . Marital status: Single    Spouse name: Not on file  . Number of children: 1  . Years of education: Not on file  . Highest education level: Not on file  Occupational History  . Occupation: LPN    Comment: works at [employer] Use  . Smoking status: Never Smoker  . Smokeless tobacco: Never Used  Substance and Sexual Activity  . Alcohol use: Yes    Comment: occasional  . Drug use: No  . Sexual activity: Not Currently    Birth control/protection: Pill  Other Topics Concern  . Not on file  Social History Narrative  . Not on file   Social Determinants of Health   Financial Resource Strain:   . Difficulty of Paying Living Expenses: Not on file  Food Insecurity:   . Worried About Charity fundraiser in the Last Year: Not on file  . Ran Out of Food in the Last Year: Not on file  Transportation Needs:   . Lack of Transportation (Medical): Not on file  . Lack of Transportation (Non-Medical): Not on file  Physical Activity:   . Days of Exercise per Week: Not on file  . Minutes of Exercise per Session: Not on file  Stress:   . Feeling of Stress : Not on file  Social Connections:   . Frequency of Communication with Friends and Family: Not on file  . Frequency of Social Gatherings with Friends and Family: Not on file  . Attends Religious Services: Not on file  . Active Member of Clubs or Organizations: Not on file  . Attends Archivist Meetings: Not on file  . Marital Status: Not on file  Intimate Partner Violence:   . Fear of Current or Ex-Partner: Not on file  . Emotionally Abused: Not on file  . Physically Abused: Not on file  . Sexually Abused: Not on file     Current Outpatient Medications:  .  amphetamine-dextroamphetamine (ADDERALL) 20 MG tablet, Take 20 mg by mouth 2 (two) times daily as needed., Disp: , Rfl:  .  Azelastine HCl 0.15 % SOLN,  USE AS DIRECTED 1-2 SPRAY PER NARES DAILY, Disp: , Rfl: 1 .  clonazePAM (KLONOPIN) 0.5 MG tablet, Take 0.5 mg by mouth 2 (two) times daily as needed for anxiety., Disp: , Rfl:  .  ketoconazole (NIZORAL) 2 % shampoo, APPLY TO SCALP 3 X WEEK,LEAVE ON 5 MINUTES, RINSE WELL., Disp: , Rfl: 10 .  Multiple Vitamin (MULTI-VITAMINS) TABS, Take by mouth., Disp: , Rfl:  .  RETIN-A 0.025 % cream, APPLY A PEA SIZED AMOUNT TO DRY FACE NIGHTLY, Disp: , Rfl: 3 .  traMADol (ULTRAM) 50 MG tablet, Take 50 mg by mouth daily as needed., Disp: , Rfl:  .  valACYclovir (VALTREX) 1000 MG tablet, Take 2 tablets (2,000 mg total) by mouth 2 (two) times daily., Disp: 4 tablet, Rfl: 0 .  Vitamin D, Ergocalciferol, (DRISDOL) 50000 units CAPS capsule, Take 50,000 Units by mouth every 7 (seven) days., Disp: , Rfl:  .  norethindrone (MICRONOR) 0.35 MG tablet, Take 1 tablet (0.35 mg total) by mouth daily., Disp: 84 tablet, Rfl: 3     ROS:  Review of Systems  Constitutional: Negative for fatigue, fever and unexpected weight change.  Respiratory: Negative for cough, shortness of breath and wheezing.   Cardiovascular: Negative for chest pain, palpitations and leg swelling.  Gastrointestinal: Negative for blood in stool, constipation, diarrhea, nausea and vomiting.  Endocrine: Negative for cold intolerance, heat intolerance and polyuria.  Genitourinary: Negative for dyspareunia, dysuria, flank pain, frequency, genital sores, hematuria, menstrual problem, pelvic pain, urgency, vaginal bleeding, vaginal discharge and vaginal pain.  Musculoskeletal: Negative for back pain, joint swelling and myalgias.  Skin: Negative for rash.  Neurological: Negative for dizziness, syncope, light-headedness, numbness and headaches.  Hematological: Negative for adenopathy.  Psychiatric/Behavioral: Negative for agitation, confusion, sleep disturbance and suicidal ideas. The patient is not nervous/anxious.    BREAST: No symptoms   Objective: BP  110/70   Ht 5\' 4"  (1.626 m)   Wt 176 lb (79.8 kg)   LMP 07/26/2019 (Approximate)   BMI 30.21 kg/m    Physical Exam Constitutional:      Appearance: She is well-developed.  Genitourinary:     Vulva, vagina, cervix, uterus, right adnexa and left adnexa normal.     No vulval lesion or tenderness noted.     No vaginal discharge, erythema or tenderness.     No cervical polyp.     Uterus is not enlarged or tender.     No right or left adnexal mass present.     Right adnexa not tender.     Left adnexa not tender.  Neck:     Thyroid: No thyromegaly.  Cardiovascular:     Rate and Rhythm: Normal rate and regular rhythm.     Heart sounds: Normal heart sounds. No murmur.  Pulmonary:     Effort: Pulmonary effort is normal.     Breath sounds: Normal breath sounds.  Chest:     Breasts:        Right: No mass, nipple discharge, skin change or tenderness.        Left: No mass, nipple discharge, skin change or tenderness.     Comments: MONTGOMERY GLANDS BILAT WNL Abdominal:     Palpations: Abdomen is soft.     Tenderness: There is no abdominal tenderness. There is no guarding.  Musculoskeletal:        General: Normal range of motion.     Cervical back: Normal range of motion.  Neurological:     General: No focal deficit present.     Mental Status: She is alert and oriented to person, place, and time.     Cranial Nerves: No cranial nerve deficit.  Skin:    General: Skin is warm and dry.  Psychiatric:        Mood and Affect: Mood normal.        Behavior: Behavior normal.        Thought Content: Thought content normal.        Judgment: Judgment normal.  Vitals reviewed.     Assessment/Plan: Encounter for annual routine gynecological examination  Encounter for surveillance of contraceptive pills - Plan: norethindrone (MICRONOR) 0.35 MG tablet; POP RF.   Normal breast exam--reassurance re: montgomery glands. Can try warm compresses, minimize expression so as to not cause  infection.  Meds ordered this encounter  Medications  . norethindrone (MICRONOR) 0.35 MG tablet    Sig: Take 1 tablet (0.35 mg total) by mouth daily.    Dispense:  84 tablet    Refill:  3    Order Specific Question:   Supervising Provider    Answer:   Gae Dry [id number]             GYN counsel adequate intake of calcium and vitamin D, diet and exercise     F/U  Return in about 1 year (around 08/08/2020).  Sonora Catlin B. Mateusz Neilan, PA-C 08/09/2019 10:59 AM

## 2019-08-09 ENCOUNTER — Encounter: Payer: Self-pay | Admitting: Obstetrics and Gynecology

## 2019-08-09 ENCOUNTER — Other Ambulatory Visit: Payer: Self-pay

## 2019-08-09 ENCOUNTER — Ambulatory Visit (INDEPENDENT_AMBULATORY_CARE_PROVIDER_SITE_OTHER): Payer: BC Managed Care – PPO | Admitting: Obstetrics and Gynecology

## 2019-08-09 VITALS — BP 110/70 | Ht 64.0 in | Wt 176.0 lb

## 2019-08-09 DIAGNOSIS — Z01419 Encounter for gynecological examination (general) (routine) without abnormal findings: Secondary | ICD-10-CM | POA: Diagnosis not present

## 2019-08-09 DIAGNOSIS — Z3041 Encounter for surveillance of contraceptive pills: Secondary | ICD-10-CM

## 2019-08-09 DIAGNOSIS — Z0000 Encounter for general adult medical examination without abnormal findings: Secondary | ICD-10-CM

## 2019-08-09 MED ORDER — NORETHINDRONE 0.35 MG PO TABS: 1.0000 | ORAL_TABLET | Freq: Every day | ORAL | 3 refills | Status: DC

## 2019-08-09 NOTE — Patient Instructions (Signed)
I value your feedback and entrusting us with your care. If you get a [facility] patient survey, I would appreciate you taking the time to let us know about your experience today. Thank you!  As of June 07, 2019, your lab results will be released to your MyChart immediately, before I even have a chance to see them. Please give me time to review them and contact you if there are any abnormalities. Thank you for your patience.  

## 2020-01-11 ENCOUNTER — Telehealth: Payer: Self-pay

## 2020-01-11 NOTE — Telephone Encounter (Signed)
Pt calling for record or copy of varicella titer from 2012 or 2013.  [phone] call to pt to let her know our Med Rec person is out of the office this pm.  Pt needs this for her job.  Please call when ready to p/u.

## 2020-01-14 ENCOUNTER — Telehealth: Payer: Self-pay

## 2020-01-14 NOTE — Telephone Encounter (Signed)
Spoke with patient copy of Varicella Titier left up front for patient pick up.

## 2020-01-22 ENCOUNTER — Telehealth: Payer: Self-pay

## 2020-01-22 NOTE — Telephone Encounter (Addendum)
Pt calling for interpretation of varicella titer from 2013.  [phone]  Results of <0.91 which is non-immune.  Looked in [facility] to try to find proof of varicella immunization given after delivery but did not find anything mention of it. I called L&D to see if they could help me.  Spoke c Janett Billow who looked in [employer] but could not find anything as they did not have Epic then.  Charmian Muff was asked if any way to get into their old system.  She adv requesting it from Medical Records [phone].  Called Med Rec and was told to send in a request.  Request sent - confirmation recv'd.

## 2020-01-29 NOTE — Telephone Encounter (Signed)
I haven't seen anything come across on the machine as of today.

## 2020-02-01 NOTE — Telephone Encounter (Signed)
Re-faxed today.

## 2020-02-15 NOTE — Telephone Encounter (Signed)
Left detailed msg faxed request to MR and have not heard back from them.

## 2020-07-03 DIAGNOSIS — R350 Frequency of micturition: Secondary | ICD-10-CM | POA: Diagnosis not present

## 2020-08-26 DIAGNOSIS — O09891 Supervision of other high risk pregnancies, first trimester: Secondary | ICD-10-CM | POA: Diagnosis not present

## 2020-08-26 DIAGNOSIS — Z3481 Encounter for supervision of other normal pregnancy, first trimester: Secondary | ICD-10-CM | POA: Diagnosis not present

## 2020-08-26 DIAGNOSIS — O99112 Other diseases of the blood and blood-forming organs and certain disorders involving the immune mechanism complicating pregnancy, second trimester: Secondary | ICD-10-CM | POA: Diagnosis not present

## 2020-08-26 DIAGNOSIS — O99211 Obesity complicating pregnancy, first trimester: Secondary | ICD-10-CM | POA: Diagnosis not present

## 2020-08-28 ENCOUNTER — Other Ambulatory Visit: Payer: Self-pay | Admitting: Obstetrics & Gynecology

## 2020-08-28 ENCOUNTER — Ambulatory Visit: Payer: BLUE CROSS/BLUE SHIELD

## 2020-08-28 DIAGNOSIS — D6851 Activated protein C resistance: Secondary | ICD-10-CM

## 2020-09-02 ENCOUNTER — Other Ambulatory Visit: Payer: Self-pay

## 2020-09-02 ENCOUNTER — Ambulatory Visit: Payer: Medicaid Other | Attending: Obstetrics and Gynecology | Admitting: Obstetrics and Gynecology

## 2020-09-02 ENCOUNTER — Ambulatory Visit: Payer: Medicaid Other | Attending: Obstetrics and Gynecology

## 2020-09-02 DIAGNOSIS — O34219 Maternal care for unspecified type scar from previous cesarean delivery: Secondary | ICD-10-CM

## 2020-09-02 DIAGNOSIS — O350XX0 Maternal care for (suspected) central nervous system malformation in fetus, not applicable or unspecified: Secondary | ICD-10-CM

## 2020-09-02 DIAGNOSIS — Z3A16 16 weeks gestation of pregnancy: Secondary | ICD-10-CM | POA: Diagnosis not present

## 2020-09-02 DIAGNOSIS — O99112 Other diseases of the blood and blood-forming organs and certain disorders involving the immune mechanism complicating pregnancy, second trimester: Secondary | ICD-10-CM | POA: Insufficient documentation

## 2020-09-02 DIAGNOSIS — O99212 Obesity complicating pregnancy, second trimester: Secondary | ICD-10-CM | POA: Insufficient documentation

## 2020-09-02 DIAGNOSIS — D6851 Activated protein C resistance: Secondary | ICD-10-CM | POA: Insufficient documentation

## 2020-09-02 DIAGNOSIS — Z369 Encounter for antenatal screening, unspecified: Secondary | ICD-10-CM | POA: Diagnosis not present

## 2020-09-02 DIAGNOSIS — O358XX0 Maternal care for other (suspected) fetal abnormality and damage, not applicable or unspecified: Secondary | ICD-10-CM | POA: Insufficient documentation

## 2020-09-02 NOTE — Progress Notes (Signed)
Maternal-Fetal Medicine   Name: Cynthia Davidson DOB: 09/14/1985 MRN: [id number] Referring Provider: Hassan Buckler, CNM  I had the pleasure of seeing [name] today at [facility] She is G5 P1041 at 16-weeks' gestation and is here for ultrasound and consultation. On previous screening, she has Factor V Leiden mutation.  PMH: Patient was screened for thrombophilia because of family history (aunt's and uncle's) of stroke. Factor V Leiden heterozygous mutation was detected. No other thrombophilia including antithrombin III deficiency was detected. She does not have a personal history of venous thromboembolism (VTE). No history of diabetes or hypertension or any other chronic medical conditions.  PSH: Cesarean section, breast biopsy, wisdom tooth surgery, jaw surgery (TMJ), tonsillectomy. Medications: Prenatal vitamins. Allergies: Morphine ("had adverse reactions"), doxycycline ("difficulty in swallowing") Social: Denies tobacco or drug or alcohol use. She is an LPN. Her partner (father of her first baby) is in good health. Family history: Both parents and half-sister do not have history of VTE. PGM had brain aneurysm and PGF had aortic aneurysm. Stroke in uncle's & aunt's families.  Obstetric history is significant for a term cesarean delivery (non-reassuring fetal heart trace) in 2013 of a female infant [employer]) weighing 8-2 at birth. Her son is in good health. Gyn history: No history of abnormal Pap smears or cervical surgeries. History of breast biopsy (benign). Previous menstrual cycles were regular.  Prenatal: On cell-free fetal DNA screening, the risks of fetal aneuploidies including trisomy 67 and trisomy 21 were not increased. Early ultrasound dating was consistent with LMP-established EDD.  Ultrasound We performed fetal anatomy scan. 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  No other makers of aneuploidies or fetal structural defects are seen. Fetal biometry is  consistent with her previously established dates. Amniotic fluid is normal and good fetal activity is seen. Patient understands the limitations of ultrasound in detecting fetal anomalies.  Our concerns include: Factor V Leiden mutation: -Its prevalence in African American population is about 1.2% (Caucasians 5% and Hispanic Americans 2.2%). -Factor V Leiden accounts for about 40% of all venous thromboembolisms in pregnancy.  -Pregnancy increases the risk of venous thromboembolism (0.5-2.0% per 1,000 pregnant women).  -Patient does not give personal history of venous thromboembolism (hormonally-related or otherwise). She does not have a family history of VTE. -Personal and family history of VTE increase the risk of VTE.  -In the absence of personal or family history, I do not recommend prophylactic anticoagulation. Although Factor V Leiden increases the risk, the absolute risk is very small.  -If patient undergoes cesarean delivery, I recommend prophylactic anticoagulation for 6 weeks after delivery. Low-molecular weight heparin (lovenox 40 mg) may be started 8 to 12 hours after cesarean delivery.  Choroid plexus cysts (CPC) I counseled the patient that isolated CPC is only rarely associated with chromosomal anomaly (trisomy 18). I also reassured her that [facility] is not associated with structural malformations in the brain. CPCs usually resolve with advancing gestation. I informed the patient that given that she had low rik for fetal aneuploidies on cell-free fetal DNA screening, finding of CPC should be considered a normal variant and that the risk of trisomy 18 is not increased. I also reassured that [place] resolves with advancing gestation and is not associated with any intracranial malformations.  Recommendations -An appointment was made for her to return in 4 weeks for completion of fetal anatomy scan. -Heparin anticoagulation is not indicated in pregnancy. -If patient undergoes cesarean delivery,  she should receive heparin (lovenox) up to 6 weeks  after delivery.   Thank you for consultation. Please contact me if you have any questions.  Consultation including face-to-face counseling 45 minutes.

## 2020-09-22 ENCOUNTER — Other Ambulatory Visit: Payer: Self-pay | Admitting: Obstetrics & Gynecology

## 2020-09-22 DIAGNOSIS — Z98891 History of uterine scar from previous surgery: Secondary | ICD-10-CM

## 2020-09-22 DIAGNOSIS — O99119 Other diseases of the blood and blood-forming organs and certain disorders involving the immune mechanism complicating pregnancy, unspecified trimester: Secondary | ICD-10-CM

## 2020-09-22 DIAGNOSIS — O99212 Obesity complicating pregnancy, second trimester: Secondary | ICD-10-CM

## 2020-09-22 DIAGNOSIS — D6851 Activated protein C resistance: Secondary | ICD-10-CM

## 2020-09-23 DIAGNOSIS — Z3481 Encounter for supervision of other normal pregnancy, first trimester: Secondary | ICD-10-CM | POA: Diagnosis not present

## 2020-09-30 ENCOUNTER — Ambulatory Visit: Payer: Medicaid Other | Attending: Obstetrics

## 2020-09-30 ENCOUNTER — Other Ambulatory Visit: Payer: Self-pay

## 2020-09-30 DIAGNOSIS — Z3A20 20 weeks gestation of pregnancy: Secondary | ICD-10-CM | POA: Diagnosis not present

## 2020-09-30 DIAGNOSIS — O99212 Obesity complicating pregnancy, second trimester: Secondary | ICD-10-CM | POA: Insufficient documentation

## 2020-09-30 DIAGNOSIS — Z98891 History of uterine scar from previous surgery: Secondary | ICD-10-CM | POA: Insufficient documentation

## 2020-09-30 DIAGNOSIS — E669 Obesity, unspecified: Secondary | ICD-10-CM | POA: Insufficient documentation

## 2020-09-30 DIAGNOSIS — D6851 Activated protein C resistance: Secondary | ICD-10-CM | POA: Diagnosis not present

## 2020-09-30 DIAGNOSIS — O358XX0 Maternal care for other (suspected) fetal abnormality and damage, not applicable or unspecified: Secondary | ICD-10-CM | POA: Diagnosis not present

## 2020-09-30 DIAGNOSIS — O99119 Other diseases of the blood and blood-forming organs and certain disorders involving the immune mechanism complicating pregnancy, unspecified trimester: Secondary | ICD-10-CM | POA: Diagnosis not present

## 2020-10-06 ENCOUNTER — Encounter: Payer: Self-pay | Admitting: Emergency Medicine

## 2020-10-06 ENCOUNTER — Other Ambulatory Visit: Payer: Self-pay

## 2020-10-06 ENCOUNTER — Emergency Department
Admission: EM | Admit: 2020-10-06 | Discharge: 2020-10-06 | Disposition: A | Discharge status: Home / Self Care | Payer: Medicaid Other | Attending: Emergency Medicine | Admitting: Emergency Medicine

## 2020-10-06 ENCOUNTER — Emergency Department: Payer: Medicaid Other

## 2020-10-06 DIAGNOSIS — G43009 Migraine without aura, not intractable, without status migrainosus: Secondary | ICD-10-CM

## 2020-10-06 DIAGNOSIS — Z3A21 21 weeks gestation of pregnancy: Secondary | ICD-10-CM | POA: Insufficient documentation

## 2020-10-06 DIAGNOSIS — R42 Dizziness and giddiness: Secondary | ICD-10-CM | POA: Diagnosis not present

## 2020-10-06 DIAGNOSIS — R0602 Shortness of breath: Secondary | ICD-10-CM | POA: Diagnosis not present

## 2020-10-06 DIAGNOSIS — R079 Chest pain, unspecified: Secondary | ICD-10-CM | POA: Diagnosis not present

## 2020-10-06 DIAGNOSIS — O99891 Other specified diseases and conditions complicating pregnancy: Secondary | ICD-10-CM | POA: Insufficient documentation

## 2020-10-06 DIAGNOSIS — O99512 Diseases of the respiratory system complicating pregnancy, second trimester: Secondary | ICD-10-CM | POA: Diagnosis not present

## 2020-10-06 DIAGNOSIS — O99412 Diseases of the circulatory system complicating pregnancy, second trimester: Secondary | ICD-10-CM | POA: Diagnosis not present

## 2020-10-06 LAB — BASIC METABOLIC PANEL
Anion gap: 7 (ref 5–15)
BUN: 8 mg/dL (ref 6–20)
CO2: 25 mmol/L (ref 22–32)
Calcium: 8.7 mg/dL — ABNORMAL LOW (ref 8.9–10.3)
Chloride: 103 mmol/L (ref 98–111)
Creatinine, Ser: 0.62 mg/dL (ref 0.44–1.00)
GFR, Estimated: >60 mL/min (ref >60)
Glucose, Bld: 96 mg/dL (ref 70–99)
Potassium: 3.5 mmol/L (ref 3.5–5.1)
Sodium: 135 mmol/L (ref 135–145)

## 2020-10-06 LAB — CBC
HCT: 36.8 % (ref 36.0–46.0)
Hemoglobin: 13 g/dL (ref 12.0–15.0)
MCH: 32.3 pg (ref 26.0–34.0)
MCHC: 35.3 g/dL (ref 30.0–36.0)
MCV: 91.3 fL (ref 80.0–100.0)
Platelets: 176 10*3/uL (ref 150–400)
RBC: 4.03 MIL/uL (ref 3.87–5.11)
RDW: 12.8 % (ref 11.5–15.5)
WBC: 7.1 10*3/uL (ref 4.0–10.5)
nRBC: 0 % (ref 0.0–0.2)

## 2020-10-06 LAB — TROPONIN I (HIGH SENSITIVITY): Troponin I (High Sensitivity): 2 ng/L (ref <18)

## 2020-10-06 MED ORDER — MECLIZINE HCL 25 MG PO TABS: 25.0000 mg | ORAL_TABLET | Freq: Three times a day (TID) | ORAL | 0 refills | Status: DC | PRN

## 2020-10-06 MED ORDER — SODIUM CHLORIDE 0.9 % IV BOLUS
250.0000 mL | Freq: Once | INTRAVENOUS | Status: AC
  Administered 2020-10-06: 250 mL via INTRAVENOUS

## 2020-10-06 MED ORDER — DIPHENHYDRAMINE HCL 50 MG/ML IJ SOLN
12.5000 mg | INTRAMUSCULAR | Status: AC
  Administered 2020-10-06: 12.5 mg via INTRAVENOUS
  Filled 2020-10-06: qty 1

## 2020-10-06 MED ORDER — LACTATED RINGERS IV BOLUS
1000.0000 mL | Freq: Once | INTRAVENOUS | Status: AC
  Administered 2020-10-06: 1000 mL via INTRAVENOUS

## 2020-10-06 MED ORDER — MECLIZINE HCL 25 MG PO TABS
12.5000 mg | ORAL_TABLET | Freq: Once | ORAL | Status: AC
  Administered 2020-10-06: 12.5 mg via ORAL
  Filled 2020-10-06: qty 1

## 2020-10-06 MED ORDER — METOCLOPRAMIDE HCL 5 MG/ML IJ SOLN
10.0000 mg | INTRAMUSCULAR | Status: AC
  Administered 2020-10-06: 10 mg via INTRAVENOUS
  Filled 2020-10-06: qty 2

## 2020-10-06 NOTE — ED Triage Notes (Signed)
C/O dizziness, sob since 0400.  EDC: 02/16/2021  G2 P1  Seen by Matt Holmes

## 2020-10-06 NOTE — ED Notes (Signed)
No pad to sign for dc, pt verbalizes understanding and rx to [facility] to [facility], all questions answered

## 2020-10-06 NOTE — Discharge Instructions (Addendum)
As we discussed, your evaluation and work-up was reassuring today.  We talked about the risks and benefits associated with getting a CT scan of the chest to rule out blood clots in the lungs, and we both agreed that it was not necessary or recommended at this time.  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  There may be other alternatives to discuss with your regular doctors as well.  If vertigo/dizziness continues to be an issue, we also provided the number for Dr. Kathyrn Sheriff and his colleagues with ENT.  They are an option for you should you continue to have the specific concern, but you will likely benefit more from following up with your regular primary care provider and OB/GYN.    Return to the emergency department if you develop new or worsening symptoms that concern you.

## 2020-10-06 NOTE — ED Notes (Signed)
Pt to ED c/o SOB and dizziness ("like head spinning" that started this morning around 0400. Also has HA. Pt has felt nauseous today, no vomining. Has eaten 1 honey bun today, nothing else. Had chest tightness and SOB as well, started at 0400 and lasted approx 2 hours. Has resolved. SPO2 100%.  FHT 145 with moderate variability.  Pt states has felt anxious during this pregnancy. States used to take Klonopin for anxiety but stopped in December when found out pregnant.

## 2020-10-06 NOTE — ED Provider Notes (Addendum)
[facility] Emergency Department Provider Note  ____________________________________________   Event Date/Time   First MD Initiated Contact with Patient 10/06/20 1011     (approximate)  I have reviewed the triage vital signs and the nursing notes.   HISTORY  Chief Complaint Dizziness and Shortness of Breath    HPI Cynthia Davidson is a 35 y.o. female G3, P1 Ab1 at approximately [redacted] weeks gestation.  She presents for evaluation of acute onset dizziness during the night.  She said it started all of a sudden at about 4:00 in the morning and kept her from being able to sleep well.  She has never had this issue in the past.  She said that she feels dehydrated but she has not been having any nausea or vomiting recently.  She felt like the room was spinning.  She had no numbness or tingling in her extremities.  She felt off balance like it was difficult to walk as a result of her symptoms.  After that happen she started to feel short of breath with some chest pain.  This could have been anxiety or panic attack which she is not certain she has been dealing with anxiety and panic attacks for many years and this felt different.  She called her OB/GYN and was told that if her symptoms persisted then she should come for evaluation.  She said that she feels better now but still feels little bit dizzy or swimmy headed.  Nothing in particular makes the symptoms better.  Getting up and moving around makes it worse.  She has no recent fever, sore throat, neck pain, abdominal pain, nor vaginal bleeding.  The patient has a family history of factor V Leiden deficiency with blood clots but she personally has never had any blood clots.  She did the genetic testing and found that she was heterozygous but has not had any personal issues.  However she was told by her doctor that after her pregnancy she was going to be started on Lovenox.         Past Medical History:  Diagnosis Date   . ADD (attention deficit disorder)   . Anxiety attack   . Factor 5 Leiden mutation, heterozygous ([place]) 07/2016   hematology [place]  . Guttate psoriasis   . Mastitis, associated with childbirth 02/2012   required hospitalization and IV antibiotics  . Panic attack     Patient Active Problem List   Diagnosis Date Noted  . Previous cesarean section 09/11/2016  . ADD (attention deficit disorder) 09/11/2016  . Anxiety 09/11/2016  . Cervical adenopathy 08/20/2016  . Factor V Leiden mutation ([place]) 08/20/2016  . 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 Fatigue 03/13/2015    Past Surgical History:  Procedure Laterality Date  . BIOPSY BREAST Bilateral 2008  . CESAREAN SECTION  02/26/2012   active phase arrest (FTP)  . 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 SINUS IRRIGATION    . TONSILLECTOMY    . WISDOM TOOTH EXTRACTION      Prior to Admission medications   Medication Sig Start Date End Date Taking? Authorizing Provider  meclizine (ANTIVERT) 25 MG tablet Take 1 tablet (25 mg total) by mouth 3 (three) times daily as needed for dizziness. 10/06/20  Yes Hinda Kehr, MD  escitalopram (LEXAPRO) 5 MG tablet Take 5 mg by mouth daily.    [provider]  ketoconazole (NIZORAL) 2 % shampoo APPLY TO SCALP 3 X WEEK,LEAVE ON 5 MINUTES, RINSE  WELL. Patient not taking: Reported on 09/30/2020 04/30/16   [provider]  Multiple Vitamin (MULTI-VITAMINS) TABS Take by mouth.    [provider]  Vitamin D, Ergocalciferol, (DRISDOL) 50000 units CAPS capsule Take 50,000 Units by mouth every 7 (seven) days.    [provider]    Allergies Doxycycline and Morphine  Family History  Problem Relation Age of Onset  . Stroke Maternal Uncle   . Factor V Leiden deficiency Maternal Uncle   . Stroke Paternal Aunt   . Factor V Leiden deficiency Paternal Aunt   . Stroke Maternal Grandfather   . Melanoma Paternal Grandmother   . Cerebral aneurysm Paternal Grandmother   .  Heart disease Paternal Grandfather   . Heart attack Paternal Grandfather   . Aortic aneurysm Paternal Grandfather     Social History Social History   Tobacco Use  . Smoking status: Never Smoker  . Smokeless tobacco: Never Used  Vaping Use  . Vaping Use: Never used  Substance Use Topics  . Alcohol use: Yes    Comment: occasional  . Drug use: No    Review of Systems Constitutional: No fever/chills Eyes: No visual changes. ENT: No sore throat. Cardiovascular: Positive for chest pain, now improved Respiratory: Positive for shortness of breath, now improved Gastrointestinal: No abdominal pain.  No nausea, no vomiting.  No diarrhea.  No constipation. Genitourinary: Negative for dysuria. Musculoskeletal: Negative for neck pain.  Negative for back pain. Integumentary: Negative for rash. Neurological: Dizziness/lightheadedness, vertiginous, severe earlier now better.  Positive for posterior headache.  No focal weakness or numbness.   ____________________________________________   PHYSICAL EXAM:  VITAL SIGNS: ED Triage Vitals  Enc Vitals Group     BP 10/06/20 0937 121/66     Pulse Rate 10/06/20 0937 (!) 105     Resp 10/06/20 0937 20     Temp 10/06/20 0937 97.6 F (36.4 C)     Temp Source 10/06/20 0937 Oral     SpO2 10/06/20 0937 100 %     Weight 10/06/20 0933 83.9 kg (185 lb)     Height 10/06/20 0933 1.626 m (5\' 4" )     Head Circumference --      Peak Flow --      Pain Score 10/06/20 0932 4     Pain Loc --      Pain Edu? --      Excl. in [place]? --     Constitutional: Alert and oriented.  Eyes: Conjunctivae are normal.  Pupils are equal and reactive bilaterally. Head: Atraumatic. Nose: No congestion/rhinnorhea. Mouth/Throat: Patient is wearing a mask. Neck: No stridor.  No meningeal signs.   Cardiovascular: Borderline tachycardia, regular rhythm. Good peripheral circulation. Respiratory: Normal respiratory effort.  No retractions. Gastrointestinal: Soft and  nontender. No distention.  Musculoskeletal: No lower extremity tenderness nor edema. No gross deformities of extremities. Neurologic:  Normal speech and language. No gross focal neurologic deficits are appreciated.  No dysmetria on finger-to-nose testing.  No nystagmus. Skin:  Skin is warm, dry and intact. Psychiatric: Mood and affect are normal. Speech and behavior are normal.  ____________________________________________   LABS (all labs ordered are listed, but only abnormal results are displayed)  Labs Reviewed  BASIC METABOLIC PANEL - Abnormal; Notable for the following components:      Result Value   Calcium 8.7 (*)    All other components within normal limits  CBC  TROPONIN I (HIGH SENSITIVITY)   ____________________________________________  EKG  ED ECG REPORT I, Tommi Rumps  Karma Greaser, the attending physician, personally viewed and interpreted this ECG.  Date: 10/06/2020 EKG Time: 9:34 AM Rate: 102 Rhythm: Sinus tachycardia QRS Axis: normal Intervals: normal ST/T Wave abnormalities: Non-specific ST segment / T-wave changes, but no clear evidence of acute ischemia. Narrative Interpretation: no definitive evidence of acute ischemia; does not meet STEMI criteria.   ____________________________________________  RADIOLOGY Patient left the ED prior to receiving MR Brain. ____________________________________________   PROCEDURES   Procedure(s) performed (including Critical Care):  .1-3 Lead EKG Interpretation Performed by: Hinda Kehr, MD Authorized by: Hinda Kehr, MD     Interpretation: abnormal     ECG rate:  101   ECG rate assessment: tachycardic     Rhythm: sinus tachycardia     Ectopy: none     Conduction: normal       ____________________________________________   INITIAL IMPRESSION / MDM / ASSESSMENT AND PLAN / ED COURSE  As part of my medical decision making, I reviewed the following data within the [place] notes  reviewed and incorporated, Labs reviewed , EKG interpreted , Old chart reviewed and Notes from prior ED visits   Differential diagnosis includes, but is not limited to, volume depletion, peripheral vertigo, central vertigo including CVA/TIA, pulmonary embolism, (complicated) migraine.  The patient is on the cardiac monitor to evaluate for evidence of arrhythmia and/or significant heart rate changes.  The patient feels better now than she did earlier but was quite concerned about her vertiginous sensation earlier.  It has improved but it is still somewhat present.  She has not been eating or drinking as much as usual and feels dehydrated even though she has not had any diarrhea or vomiting recently.  She has no personal history of blood clots in the legs nor lungs and no history of stroke.  However there is a family history and she knows that she is heterozygous for factor V Leiden.  The patient is a Marine scientist and we had a risks and benefits discussion regarding her possibility/probability of pulmonary embolism.  It is possible that the chest pain or shortness of breath she was experiencing was the result of anxiety or panic attack after the vertiginous sensation.  She understands the potential risk associated with CTA chest during second trimester pregnancy.  We agreed on conservative management for now with a liter lactated Ringer's and a small dose of meclizine 12.5 mg by mouth to see if this improves her symptoms.  We will then reassess the need for possible CTA.  Similarly we can decide after symptomatic treatment if she would benefit from MR brain to rule out the possibility of CVA.     Clinical Course as of 10/06/20 1457  Mon Oct 06, 2020  1107 Labs reassuring including normal BMP, CBC, and HS-trop.  Given very low risk of ACS, discontinuing second troponin [CF]  1145 Patient recently got meclizine, getting fluids, stable. [CF]  4259 Reassessed patient.  She still feels "dizzy" (vertiginous).  No  focal neurological deficits.  We talked about it at length, and we are going to proceed with an MR brain to rule out CVA given the patient's history of factor V Leiden mutation, acute onset vertigo where she has never had the symptoms before, no alternative diagnosis that better explains the symptoms, etc.  We also had extensive conversation about her thoracic symptoms including the chest pain or shortness of breath.  We again discussed the possibility of getting a CTA chest.  She does not want to proceed with  the imaging so as to avoid the radiation to the fetus, and I think that is reasonable given that she no longer has tachycardia, has not had any hypoxemia, and that her thoracic symptoms were relatively transient and occurred when she was very concerned about the vertigo.  The vertigo is the primary issue and is safe to evaluate with him MRI, but I understand the concern and given the low risk of PE, I think it is reasonable to hold off.  Given that she is pregnant I do not feel that there is any beneficial role to D-dimer testing. [CF]  1259 Due to overwhelming emergency department patient volume, we had to move the patient out into a hallway bed while she is awaiting her MRI because we needed her exam room for a higher level acuity patient.  Unfortunately this was very upsetting to her and she decided she wanted to leave.  I talked with her and try to cancer to stay but she said that she does not want to be out in the hallway because she finds it embarrassing.  Unfortunately there is not a better alternative at the moment due to the ED census.  I think it is very unlikely that she has an acute or emergent medical condition such as CVA or PE (as we previously discussed) but she understands that she is leaving [place] prior to the completion of her evaluation.  I gave her recommendations for close outpatient follow-up and reminded her she can return to the emergency department at any point.  [CF]  7858 After talking to her ED RN, the patient has now decided that she will stay if she is allowed to wear her clothes, because she feels exposed in her hospital gown in the hallway even though she is off to the side and out of the main traffic.  I see no reason why this would be a problem, so she is changing to her regular clothes and will await the MRI. [CF]  1340 Patient continues to await MRI.  I spoke with her again and she said that her headache is really hurting, particularly in the back of her head.  This brings up the probability of migraine or complicated migraine as a more likely scenario for all of her symptoms.  She has received meclizine 12.5 mg by mouth and has received about a liter of fluids total.  I will give her metoclopramide 10 mg IV which should help both with headache and persistent nausea, and should be safe in pregnancy since it falls on both migraine and hyperemesis treatment algorithms.  Also will give diphenhydramine 12.5 mg IV; she has already received some Reglan, but that was a couple of hours ago and I do not want her to have any dyskinesia or dystonic reactions from the Reglan [CF]  1453 The patient says she feels better and is ready to go.  Fortunately her MRI is reassuring with no sign of any acute abnormality other than a possible sinusitis.  We talked about atypical migraines and how this is likely the cause of her symptoms.  She will follow up with her regular primary providers and I gave strict return precautions should she develop new or worsening symptoms.  She understands and agrees with the plan.  She was already prescribed Zofran by her OB/GYN and I suggested she try that as well as fluid rehydration. [CF]    Clinical Course User Index [CF] Hinda Kehr, MD     ____________________________________________  FINAL  CLINICAL IMPRESSION(S) / ED DIAGNOSES  Final diagnoses:  Dizziness  Atypical migraine     MEDICATIONS GIVEN DURING THIS  VISIT:  Medications  lactated ringers bolus 1,000 mL (0 mLs Intravenous Stopped 10/06/20 1250)  meclizine (ANTIVERT) tablet 12.5 mg (12.5 mg Oral Given 10/06/20 1118)  sodium chloride 0.9 % bolus 250 mL (250 mLs Intravenous New Bag/Given 10/06/20 1331)  metoCLOPramide (REGLAN) injection 10 mg (10 mg Intravenous Given 10/06/20 1436)  diphenhydrAMINE (BENADRYL) injection 12.5 mg (12.5 mg Intravenous Given 10/06/20 1435)     ED Discharge Orders         Ordered    meclizine (ANTIVERT) 25 MG tablet  3 times daily PRN        10/06/20 1256          *Please note:  Cynthia Davidson was evaluated in Emergency Department on 10/06/2020 for the symptoms described in the history of present illness. She was evaluated in the context of the global COVID-19 pandemic, which necessitated consideration that the patient might be at risk for infection with the SARS-CoV-2 virus that causes COVID-19. Institutional protocols and algorithms that pertain to the evaluation of patients at risk for COVID-19 are in a state of rapid change based on information released by regulatory bodies including the CDC and federal and state organizations. These policies and algorithms were followed during the patient's care in the ED.  Some ED evaluations and interventions may be delayed as a result of limited staffing during and after the pandemic.*  Note:  This document was prepared using Dragon voice recognition software and may include unintentional dictation errors.   Hinda Kehr, MD 10/06/20 1302    Hinda Kehr, MD 10/06/20 [phone]

## 2020-10-07 ENCOUNTER — Telehealth: Payer: Self-pay | Admitting: Dietician

## 2020-10-07 NOTE — Telephone Encounter (Signed)
Transition Care Management Follow-up Telephone Call  Date of discharge and from where: 10/06/20 [facility]  How have you been since you were released from the hospital? A lot better than I was  Any questions or concerns? No  Items Reviewed:  Did the pt receive and understand the discharge instructions provided? Yes but she wasn't pleased with her experience not happy that they wanted to do an MRI on her brain.being pregnant being pregnant" I felt humiliated. Was thrown out of the room and was told to wait 2 hrs in the hall way. I was embaressed. She wanted to leave but was told it was going to leave against medical advice." Pt just overall not happy with the experience    Medications obtained and verified? No  Pt says Meclizine doesn't work well for her and they were supposed to give her Reglan RX but she never got that RX  Other? No   Any new allergies since your discharge? No   Dietary orders reviewed? No  Do you have support at home? Yes   Home Care and Equipment/Supplies: Were home health services ordered?no If so, what is the name of the agency? no  Has the agency set up a time to come to the patient's home? yes Were any new equipment or medical supplies ordered?  No What is the name of the medical supply agency? N/A Were you able to get the supplies/equipment? not applicable Do you have any questions related to the use of the equipment or supplies? No  Functional Questionnaire: (I = Independent and D = Dependent) ADLs: I   Bathing/Dressing- I  Meal Prep- I  Eating- I  Maintaining continence- I  Transferring/Ambulation- I  Managing Meds- I  Follow up appointments reviewed:   PCP Hospital f/u appt confirmed? No pt says she will f/u with her nurse practitioner at her OBGYN office.    [facility] f/u appt confirmed? No .  Are transportation arrangements needed? No   If their condition worsens, is the pt aware to call PCP or go to the Emergency Dept.?  Yes  Was the patient provided with contact information for the PCP's office or ED? Yes  Was to pt encouraged to call back with questions or concerns? Yes

## 2020-11-19 ENCOUNTER — Other Ambulatory Visit: Payer: Self-pay | Admitting: Obstetrics & Gynecology

## 2020-11-19 DIAGNOSIS — D6851 Activated protein C resistance: Secondary | ICD-10-CM

## 2020-11-19 DIAGNOSIS — O99119 Other diseases of the blood and blood-forming organs and certain disorders involving the immune mechanism complicating pregnancy, unspecified trimester: Secondary | ICD-10-CM

## 2020-11-19 DIAGNOSIS — Z98891 History of uterine scar from previous surgery: Secondary | ICD-10-CM

## 2020-11-19 DIAGNOSIS — O350XX0 Maternal care for (suspected) central nervous system malformation in fetus, not applicable or unspecified: Secondary | ICD-10-CM

## 2020-11-19 DIAGNOSIS — O3503X0 Maternal care for (suspected) central nervous system malformation or damage in fetus, choroid plexus cysts, not applicable or unspecified: Secondary | ICD-10-CM

## 2020-11-19 DIAGNOSIS — O99212 Obesity complicating pregnancy, second trimester: Secondary | ICD-10-CM

## 2020-11-21 DIAGNOSIS — Z1389 Encounter for screening for other disorder: Secondary | ICD-10-CM | POA: Diagnosis not present

## 2020-11-21 DIAGNOSIS — Z79899 Other long term (current) drug therapy: Secondary | ICD-10-CM | POA: Diagnosis not present

## 2020-11-21 DIAGNOSIS — F331 Major depressive disorder, recurrent, moderate: Secondary | ICD-10-CM | POA: Diagnosis not present

## 2020-11-21 DIAGNOSIS — F411 Generalized anxiety disorder: Secondary | ICD-10-CM | POA: Diagnosis not present

## 2020-11-25 ENCOUNTER — Ambulatory Visit: Payer: Medicaid Other | Attending: Obstetrics and Gynecology

## 2020-11-25 ENCOUNTER — Other Ambulatory Visit: Payer: Self-pay

## 2020-11-25 DIAGNOSIS — O99119 Other diseases of the blood and blood-forming organs and certain disorders involving the immune mechanism complicating pregnancy, unspecified trimester: Secondary | ICD-10-CM | POA: Diagnosis present

## 2020-11-25 DIAGNOSIS — Z3A28 28 weeks gestation of pregnancy: Secondary | ICD-10-CM | POA: Diagnosis not present

## 2020-11-25 DIAGNOSIS — O350XX0 Maternal care for (suspected) central nervous system malformation in fetus, not applicable or unspecified: Secondary | ICD-10-CM

## 2020-11-25 DIAGNOSIS — O99212 Obesity complicating pregnancy, second trimester: Secondary | ICD-10-CM

## 2020-11-25 DIAGNOSIS — O99113 Other diseases of the blood and blood-forming organs and certain disorders involving the immune mechanism complicating pregnancy, third trimester: Secondary | ICD-10-CM | POA: Insufficient documentation

## 2020-11-25 DIAGNOSIS — O3503X0 Maternal care for (suspected) central nervous system malformation or damage in fetus, choroid plexus cysts, not applicable or unspecified: Secondary | ICD-10-CM

## 2020-11-25 DIAGNOSIS — D6851 Activated protein C resistance: Secondary | ICD-10-CM

## 2020-11-25 DIAGNOSIS — Z98891 History of uterine scar from previous surgery: Secondary | ICD-10-CM

## 2020-11-25 DIAGNOSIS — O99213 Obesity complicating pregnancy, third trimester: Secondary | ICD-10-CM | POA: Insufficient documentation

## 2020-12-11 ENCOUNTER — Encounter: Payer: BC Managed Care – PPO | Admitting: Obstetrics and Gynecology

## 2020-12-18 ENCOUNTER — Other Ambulatory Visit: Payer: Self-pay

## 2020-12-18 ENCOUNTER — Telehealth: Payer: Self-pay | Admitting: *Deleted

## 2020-12-18 ENCOUNTER — Encounter: Payer: Self-pay | Admitting: Obstetrics and Gynecology

## 2020-12-18 ENCOUNTER — Encounter: Payer: Self-pay | Admitting: *Deleted

## 2020-12-18 ENCOUNTER — Ambulatory Visit (INDEPENDENT_AMBULATORY_CARE_PROVIDER_SITE_OTHER): Payer: Medicaid Other | Admitting: Obstetrics and Gynecology

## 2020-12-18 VITALS — BP 106/70 | HR 88 | Wt 198.0 lb

## 2020-12-18 DIAGNOSIS — Z98891 History of uterine scar from previous surgery: Secondary | ICD-10-CM

## 2020-12-18 DIAGNOSIS — D6851 Activated protein C resistance: Secondary | ICD-10-CM

## 2020-12-18 DIAGNOSIS — O0990 Supervision of high risk pregnancy, unspecified, unspecified trimester: Secondary | ICD-10-CM | POA: Insufficient documentation

## 2020-12-18 DIAGNOSIS — O0993 Supervision of high risk pregnancy, unspecified, third trimester: Secondary | ICD-10-CM | POA: Diagnosis not present

## 2020-12-18 LAB — OB RESULTS CONSOLE RUBELLA ANTIBODY, IGM: Rubella: IMMUNE

## 2020-12-18 LAB — OB RESULTS CONSOLE HEPATITIS B SURFACE ANTIGEN: Hepatitis B Surface Ag: NEGATIVE

## 2020-12-18 NOTE — Progress Notes (Signed)
    PRENATAL VISIT NOTE Transfer of care visit from [facility] clinic due to desire to deliver at women's due to poor experience with [facility] and at [facility] with last delivery   Subjective:  Cynthia Davidson is a 35 y.o. G3P1011 at [redacted]w[redacted]d being seen today for ongoing prenatal care.  She is currently monitored for the following issues for this high-risk pregnancy and has Factor 5 Leiden mutation, heterozygous ([place]); Previous cesarean section; Anxiety; Idiopathic hypersomnia; and Supervision of high risk pregnancy, antepartum on their problem list.  Patient reports no complaints.  Contractions: Not present. Vag. Bleeding: None.  Movement: Present. Denies leaking of fluid.   The following portions of the patient's history were reviewed and updated as appropriate: allergies, current medications, past family history, past medical history, past social history, past surgical history and problem list.   Objective:   Vitals:   12/18/20 0923  BP: 106/70  Pulse: 88  Weight: 198 lb (89.8 kg)    Fetal Status: Fetal Heart Rate (bpm): 142   Movement: Present     General:  Alert, oriented and cooperative. Patient is in no acute distress.  Skin: Skin is warm and dry. No rash noted.   Cardiovascular: Normal heart rate noted  Respiratory: Normal respiratory effort, no problems with respiration noted  Abdomen: Soft, gravid, appropriate for gestational age.  Pain/Pressure: Absent     Pelvic: Cervical exam deferred        Extremities: Normal range of motion.     Mental Status: Normal mood and affect. Normal behavior. Normal judgment and thought content.   Assessment and Plan:  Pregnancy: G3P1011 at [redacted]w[redacted]d 1. Supervision of high risk pregnancy in third trimester Routine care. Pt strongly considering BTL. R/b/a d/w her.. papers signed today.  - Glucose Tolerance, 2 Hours w/1 Hour - CBC - RPR - HIV Antibody (routine testing w rflx) - Ambulatory referral to Chiropractic  2. Supervision of high risk  pregnancy, antepartum  3. Factor 5 Leiden mutation, heterozygous ([place]) Pt never has had a VTE; she states a lot of people in her family were having strokes so recommended she get tested, too, so that's how she found out. S/p MFM consult already and recommendation is for 6 wks of PP lovenox which I d/w her as well. Continue with growth u/s on 7/12  4. Previous cesarean section Patient desires repeat. Request sent for rpt c/s and BTL.   Preterm labor symptoms and general obstetric precautions including but not limited to vaginal bleeding, contractions, leaking of fluid and fetal movement were reviewed in detail with the patient. Please refer to After Visit Summary for other counseling recommendations.   Return in about 20 days (around 01/07/2021) for low risk ob, in person, md or app.  Future Appointments  Date Time Provider [place]  01/06/2021  9:00 AM [facility]  01/07/2021  4:15 PM Caren Macadam, MD [facility]  01/19/2021  4:15 PM Caren Macadam, MD [facility]  02/13/2021  8:10 AM [facility] None    Aletha Halim, MD

## 2020-12-18 NOTE — Telephone Encounter (Signed)
Call to patient. Advised c-section scheduled for 02-12-21 at 0930 and arrive 0730. Advised will receive call from pre-op nurse with additional instructions. Letter and hospital brochure will be mailed.   Encounter closed.

## 2020-12-18 NOTE — Progress Notes (Signed)
Declines tdap

## 2020-12-19 LAB — HIV ANTIBODY (ROUTINE TESTING W REFLEX): HIV Screen 4th Generation wRfx: NONREACTIVE

## 2020-12-19 LAB — CBC
Hematocrit: 35 % (ref 34.0–46.6)
Hemoglobin: 12.1 g/dL (ref 11.1–15.9)
MCH: 32.6 pg (ref 26.6–33.0)
MCHC: 34.6 g/dL (ref 31.5–35.7)
MCV: 94 fL (ref 79–97)
Platelets: 163 10*3/uL (ref 150–450)
RBC: 3.71 x10E6/uL — ABNORMAL LOW (ref 3.77–5.28)
RDW: 13 % (ref 11.7–15.4)
WBC: 8.6 10*3/uL (ref 3.4–10.8)

## 2020-12-19 LAB — GLUCOSE TOLERANCE, 2 HOURS W/ 1HR
Glucose, 1 hour: 154 mg/dL (ref 65–179)
Glucose, 2 hour: 116 mg/dL (ref 65–152)
Glucose, Fasting: 84 mg/dL (ref 65–91)

## 2020-12-19 LAB — RPR: RPR Ser Ql: NONREACTIVE

## 2020-12-31 ENCOUNTER — Other Ambulatory Visit: Payer: Self-pay | Admitting: Family Medicine

## 2021-01-01 ENCOUNTER — Other Ambulatory Visit: Payer: Self-pay | Admitting: Obstetrics and Gynecology

## 2021-01-01 DIAGNOSIS — O99213 Obesity complicating pregnancy, third trimester: Secondary | ICD-10-CM

## 2021-01-01 DIAGNOSIS — D6851 Activated protein C resistance: Secondary | ICD-10-CM

## 2021-01-01 DIAGNOSIS — F331 Major depressive disorder, recurrent, moderate: Secondary | ICD-10-CM | POA: Diagnosis not present

## 2021-01-01 DIAGNOSIS — F411 Generalized anxiety disorder: Secondary | ICD-10-CM | POA: Diagnosis not present

## 2021-01-06 ENCOUNTER — Ambulatory Visit: Payer: Medicaid Other | Attending: Obstetrics and Gynecology

## 2021-01-06 ENCOUNTER — Other Ambulatory Visit: Payer: Self-pay

## 2021-01-06 DIAGNOSIS — Z3A34 34 weeks gestation of pregnancy: Secondary | ICD-10-CM | POA: Diagnosis not present

## 2021-01-06 DIAGNOSIS — E669 Obesity, unspecified: Secondary | ICD-10-CM | POA: Insufficient documentation

## 2021-01-06 DIAGNOSIS — O99113 Other diseases of the blood and blood-forming organs and certain disorders involving the immune mechanism complicating pregnancy, third trimester: Secondary | ICD-10-CM

## 2021-01-06 DIAGNOSIS — D6851 Activated protein C resistance: Secondary | ICD-10-CM | POA: Diagnosis not present

## 2021-01-06 DIAGNOSIS — O99213 Obesity complicating pregnancy, third trimester: Secondary | ICD-10-CM

## 2021-01-07 ENCOUNTER — Ambulatory Visit (INDEPENDENT_AMBULATORY_CARE_PROVIDER_SITE_OTHER): Payer: Medicaid Other | Admitting: Family Medicine

## 2021-01-07 ENCOUNTER — Encounter: Payer: Medicaid Other | Admitting: Family Medicine

## 2021-01-07 VITALS — BP 109/70 | HR 86 | Wt 200.0 lb

## 2021-01-07 DIAGNOSIS — Z98891 History of uterine scar from previous surgery: Secondary | ICD-10-CM

## 2021-01-07 DIAGNOSIS — D6851 Activated protein C resistance: Secondary | ICD-10-CM

## 2021-01-07 DIAGNOSIS — F419 Anxiety disorder, unspecified: Secondary | ICD-10-CM

## 2021-01-07 DIAGNOSIS — O0990 Supervision of high risk pregnancy, unspecified, unspecified trimester: Secondary | ICD-10-CM

## 2021-01-07 NOTE — Progress Notes (Signed)
   PRENATAL VISIT NOTE  Subjective:  Cynthia Davidson is a 35 y.o. G3P1011 at [redacted]w[redacted]d being seen today for ongoing prenatal care.  She is currently monitored for the following issues for this high-risk pregnancy and has Factor 5 Leiden mutation, heterozygous ([place]); Previous cesarean section; Anxiety; Idiopathic hypersomnia; and Supervision of high risk pregnancy, antepartum on their problem list.  Patient reports no complaints.  Contractions: Irritability. Vag. Bleeding: None.  Movement: Present. Denies leaking of fluid.   The following portions of the patient's history were reviewed and updated as appropriate: allergies, current medications, past family history, past medical history, past social history, past surgical history and problem list.   Objective:   Vitals:   01/07/21 1518  BP: 109/70  Pulse: 86  Weight: 200 lb (90.7 kg)    Fetal Status: Fetal Heart Rate (bpm): 144   Movement: Present     General:  Alert, oriented and cooperative. Patient is in no acute distress.  Skin: Skin is warm and dry. No rash noted.   Cardiovascular: Normal heart rate noted  Respiratory: Normal respiratory effort, no problems with respiration noted  Abdomen: Soft, gravid, appropriate for gestational age.  Pain/Pressure: Present     Pelvic: Cervical exam deferred        Extremities: Normal range of motion.     Mental Status: Normal mood and affect. Normal behavior. Normal judgment and thought content.   Assessment and Plan:  Pregnancy: G3P1011 at [redacted]w[redacted]d  1. Supervision of high risk pregnancy, antepartum Discussed hip and back pain.   2. Previous cesarean section Has scheduled CS, desires BTS-- 02/12/21  3. Anxiety Many questions abou  4. Factor 5 Leiden mutation, heterozygous ([place]) Plan for lovenox pp   Preterm labor symptoms and general obstetric precautions including but not limited to vaginal bleeding, contractions, leaking of fluid and fetal movement were reviewed in detail with the  patient.  Please refer to After Visit Summary for other counseling recommendations.   Return in about 2 weeks (around 01/21/2021) for Routine prenatal care.  Future Appointments  Date Time Provider [place]  01/19/2021  4:15 PM Caren Macadam, MD [facility]  02/03/2021  2:50 PM Caren Macadam, MD [facility]  02/13/2021  8:10 AM [facility] None    Caren Macadam, MD

## 2021-01-08 DIAGNOSIS — F411 Generalized anxiety disorder: Secondary | ICD-10-CM | POA: Diagnosis not present

## 2021-01-08 DIAGNOSIS — F331 Major depressive disorder, recurrent, moderate: Secondary | ICD-10-CM | POA: Diagnosis not present

## 2021-01-19 ENCOUNTER — Other Ambulatory Visit: Payer: Self-pay

## 2021-01-19 ENCOUNTER — Ambulatory Visit (INDEPENDENT_AMBULATORY_CARE_PROVIDER_SITE_OTHER): Payer: Medicaid Other | Admitting: Family Medicine

## 2021-01-19 VITALS — BP 107/70 | HR 85 | Wt 207.8 lb

## 2021-01-19 DIAGNOSIS — D6851 Activated protein C resistance: Secondary | ICD-10-CM

## 2021-01-19 DIAGNOSIS — O0990 Supervision of high risk pregnancy, unspecified, unspecified trimester: Secondary | ICD-10-CM

## 2021-01-19 DIAGNOSIS — O99891 Other specified diseases and conditions complicating pregnancy: Secondary | ICD-10-CM

## 2021-01-19 DIAGNOSIS — Z98891 History of uterine scar from previous surgery: Secondary | ICD-10-CM

## 2021-01-19 DIAGNOSIS — M549 Dorsalgia, unspecified: Secondary | ICD-10-CM

## 2021-01-19 MED ORDER — CYCLOBENZAPRINE HCL 5 MG PO TABS: 5.0000 mg | ORAL_TABLET | Freq: Three times a day (TID) | ORAL | 1 refills | Status: DC | PRN

## 2021-01-19 NOTE — Progress Notes (Signed)
   PRENATAL VISIT NOTE  Subjective:  Cynthia Davidson is a 35 y.o. G3P1011 at 38w6dbeing seen today for ongoing prenatal care.  She is currently monitored for the following issues for this high-risk pregnancy and has Factor 5 Leiden mutation, heterozygous ([place]; Previous cesarean section; Anxiety; Idiopathic hypersomnia; and Supervision of high risk pregnancy, antepartum on their problem list.  Patient reports backache- upper back. Tried massage, tylenol, chiropractic, pregnancy support belt. Located behind left shoulder. Reports prior left shoulder injury.  Contractions: Irritability. Vag. Bleeding: None.  Movement: Present. Denies leaking of fluid.   The following portions of the patient's history were reviewed and updated as appropriate: allergies, current medications, past family history, past medical history, past social history, past surgical history and problem list.   Objective:   Vitals:   01/19/21 1626  BP: 107/70  Pulse: 85  Weight: 207 lb 12.8 oz (94.3 kg)    Fetal Status: Fetal Heart Rate (bpm): 152   Movement: Present     General:  Alert, oriented and cooperative. Patient is in no acute distress.  Skin: Skin is warm and dry. No rash noted.   Cardiovascular: Normal heart rate noted  Respiratory: Normal respiratory effort, no problems with respiration noted  Abdomen: Soft, gravid, appropriate for gestational age.  Pain/Pressure: Present     Pelvic: Cervical exam deferred        Extremities: Normal range of motion.     Mental Status: Normal mood and affect. Normal behavior. Normal judgment and thought content.   Assessment and Plan:  Pregnancy: G3P1011 at 370w6d. Factor 5 Leiden mutation, heterozygous (HC[place]assfieldLovenox pp for 6 weeks  2. Previous cesarean section RCS with BTl scheduled 8/18  3. Supervision of high risk pregnancy, antepartum Up to date Reviewed back pain-- recommended possible chiropractor with other modalities/cupping/dry needling  Preterm labor  symptoms and general obstetric precautions including but not limited to vaginal bleeding, contractions, leaking of fluid and fetal movement were reviewed in detail with the patient. Please refer to After Visit Summary for other counseling recommendations.   Return in about 1 week (around 01/26/2021) for Routine prenatal care, 36wks.  Future Appointments  Date Time Provider De[place]8/09/2020  2:50 PM Anyanwu, UgSallyanne HaversMD [facility]  02/03/2021  2:50 PM NeCaren MacadamMD [facility]  02/09/2021  4:10 PM PrDonnamae JudeMD [facility]  02/13/2021  8:10 AM [facility] None    KiCaren MacadamMD

## 2021-01-29 ENCOUNTER — Ambulatory Visit (INDEPENDENT_AMBULATORY_CARE_PROVIDER_SITE_OTHER): Payer: Medicaid Other | Admitting: Obstetrics & Gynecology

## 2021-01-29 ENCOUNTER — Other Ambulatory Visit (HOSPITAL_COMMUNITY)
Admission: RE | Admit: 2021-01-29 | Discharge: 2021-01-29 | Disposition: A | Discharge status: Home / Self Care | Payer: Medicaid Other | Source: Ambulatory Visit | Attending: Obstetrics & Gynecology | Admitting: Obstetrics & Gynecology

## 2021-01-29 ENCOUNTER — Other Ambulatory Visit: Payer: Self-pay

## 2021-01-29 VITALS — BP 107/72 | HR 94 | Wt 207.4 lb

## 2021-01-29 DIAGNOSIS — Z3A37 37 weeks gestation of pregnancy: Secondary | ICD-10-CM | POA: Insufficient documentation

## 2021-01-29 DIAGNOSIS — Z98891 History of uterine scar from previous surgery: Secondary | ICD-10-CM

## 2021-01-29 DIAGNOSIS — O0990 Supervision of high risk pregnancy, unspecified, unspecified trimester: Secondary | ICD-10-CM

## 2021-01-29 DIAGNOSIS — O0993 Supervision of high risk pregnancy, unspecified, third trimester: Secondary | ICD-10-CM | POA: Diagnosis not present

## 2021-01-29 NOTE — Progress Notes (Signed)
   PRENATAL VISIT NOTE  Subjective:  Cynthia Davidson is a 35 y.o. G3P1011 at 22w2dbeing seen today for ongoing prenatal care.  She is currently monitored for the following issues for this high-risk pregnancy and has Factor 5 Leiden mutation, heterozygous ([place]; Previous cesarean section; Anxiety; Idiopathic hypersomnia; and Supervision of high risk pregnancy, antepartum on their problem list.  Patient reports no complaints.  Contractions: Not present. Vag. Bleeding: None.  Movement: Present. Denies leaking of fluid.   The following portions of the patient's history were reviewed and updated as appropriate: allergies, current medications, past family history, past medical history, past social history, past surgical history and problem list.   Objective:   Vitals:   01/29/21 1455  BP: 107/72  Pulse: 94  Weight: 207 lb 6.4 oz (94.1 kg)    Fetal Status: Fetal Heart Rate (bpm): 132   Movement: Present     General:  Alert, oriented and cooperative. Patient is in no acute distress.  Skin: Skin is warm and dry. No rash noted.   Cardiovascular: Normal heart rate noted  Respiratory: Normal respiratory effort, no problems with respiration noted  Abdomen: Soft, gravid, appropriate for gestational age.  Pain/Pressure: Present     Pelvic: Cervical exam deferred        Extremities: Normal range of motion.     Mental Status: Normal mood and affect. Normal behavior. Normal judgment and thought content.   Assessment and Plan:  Pregnancy: G3P1011 at 357w2d. Previous cesarean section RCS and BTL scheduled on 02/12/21. Now considering IUD instead.  2. [redacted] weeks gestation of pregnancy 3. Supervision of high risk pregnancy, antepartum Pelvic cultures done, will follow up results and manage accordingly. - Strep Gp B NAA - Cervicovaginal ancillary only( [place]) Term labor symptoms and general obstetric precautions including but not limited to vaginal bleeding, contractions, leaking of fluid  and fetal movement were reviewed in detail with the patient. Please refer to After Visit Summary for other counseling recommendations.   Return in about 1 week (around 02/05/2021) for OFFICE OB VISIT (MD only).  Future Appointments  Date Time Provider [place]8/02/2021  2:50 PM NeCaren MacadamMD [facility]  02/09/2021  4:10 PM PrDonnamae JudeMD [facility]  02/13/2021  8:10 AM [facility] None    UgVerita SchneidersMD

## 2021-01-29 NOTE — Patient Instructions (Signed)
Return to office for any scheduled appointments. Call the office or go to the MAU at [facility] [place] if:  You begin to have strong, frequent contractions  Your water breaks.  Sometimes it is a big gush of fluid, sometimes it is just a trickle that keeps getting your panties wet or running down your legs  You have vaginal bleeding.  It is normal to have a small amount of spotting if your cervix was checked.   You do not feel your baby moving like normal.  If you do not, get something to eat and drink and lay down and focus on feeling your baby move.   If your baby is still not moving like normal, you should call the office or go to MAU.  Any other obstetric concerns.   

## 2021-01-30 ENCOUNTER — Telehealth (HOSPITAL_COMMUNITY): Payer: Self-pay | Admitting: *Deleted

## 2021-01-30 NOTE — Patient Instructions (Signed)
Cynthia Davidson  01/30/2021   Your procedure is scheduled on:  02/12/2021  Arrive at 24 at [employer] [facility] on [address] at [facility]  and [employer] [address]. You are invited to use the FREE valet parking or use the Visitor's parking deck.  Pick up the phone at the desk and dial [phone].  Call this number if you have problems the morning of surgery: [phone]  Remember:   Do not eat food:(After Midnight) Desps de medianoche.  Do not drink clear liquids: (After Midnight) Desps de medianoche.  Take these medicines the morning of surgery with A SIP OF WATER:  none   Do not wear jewelry, make-up or nail polish.  Do not wear lotions, powders, or perfumes. Do not wear deodorant.  Do not shave 48 hours prior to surgery.  Do not bring valuables to the hospital.  [facility] is not   responsible for any belongings or valuables brought to the hospital.  Contacts, dentures or bridgework may not be worn into surgery.  Leave suitcase in the car. After surgery it may be brought to your room.  For patients admitted to the hospital, checkout time is 11:00 AM the day of              discharge.      Please read over the following fact sheets that you were given:     Preparing for Surgery

## 2021-01-30 NOTE — Telephone Encounter (Signed)
Preadmission screen  

## 2021-01-31 LAB — STREP GP B NAA: Strep Gp B NAA: NEGATIVE

## 2021-02-01 LAB — CERVICOVAGINAL ANCILLARY ONLY
Chlamydia: NEGATIVE
Comment: NEGATIVE
Comment: NORMAL
Neisseria Gonorrhea: NEGATIVE

## 2021-02-02 ENCOUNTER — Encounter (HOSPITAL_COMMUNITY): Payer: Self-pay

## 2021-02-03 ENCOUNTER — Encounter: Payer: Medicaid Other | Admitting: Family Medicine

## 2021-02-04 ENCOUNTER — Telehealth: Payer: Self-pay | Admitting: *Deleted

## 2021-02-04 ENCOUNTER — Ambulatory Visit (INDEPENDENT_AMBULATORY_CARE_PROVIDER_SITE_OTHER): Payer: Medicaid Other | Admitting: Family Medicine

## 2021-02-04 ENCOUNTER — Other Ambulatory Visit: Payer: Self-pay

## 2021-02-04 VITALS — BP 95/68 | HR 84 | Wt 207.8 lb

## 2021-02-04 DIAGNOSIS — O0990 Supervision of high risk pregnancy, unspecified, unspecified trimester: Secondary | ICD-10-CM

## 2021-02-04 DIAGNOSIS — Z98891 History of uterine scar from previous surgery: Secondary | ICD-10-CM

## 2021-02-04 DIAGNOSIS — F419 Anxiety disorder, unspecified: Secondary | ICD-10-CM | POA: Diagnosis not present

## 2021-02-04 DIAGNOSIS — D6851 Activated protein C resistance: Secondary | ICD-10-CM | POA: Diagnosis not present

## 2021-02-04 NOTE — Telephone Encounter (Signed)
Patient requested to move up scheduled c-section to 8-16 at 39 weeks. Advised this day is fully booked at this time.  Encounter closed.

## 2021-02-04 NOTE — Progress Notes (Signed)
   PRENATAL VISIT NOTE  Subjective:  Cynthia Davidson is a 35 y.o. G3P1011 at 33w1dbeing seen today for ongoing prenatal care.  She is currently monitored for the following issues for this low-risk pregnancy and has Factor 5 Leiden mutation, heterozygous ([place]; Previous cesarean section; Anxiety; Idiopathic hypersomnia; and Supervision of high risk pregnancy, antepartum on their problem list.  Patient reports no complaints.  Contractions: Not present. Vag. Bleeding: None.  Movement: Present. Denies leaking of fluid.   The following portions of the patient's history were reviewed and updated as appropriate: allergies, current medications, past family history, past medical history, past social history, past surgical history and problem list.   Objective:   Vitals:   02/04/21 0833  BP: 95/68  Pulse: 84  Weight: 207 lb 12.8 oz (94.3 kg)    Fetal Status: Fetal Heart Rate (bpm): 148   Movement: Present     General:  Alert, oriented and cooperative. Patient is in no acute distress.  Skin: Skin is warm and dry. No rash noted.   Cardiovascular: Normal heart rate noted  Respiratory: Normal respiratory effort, no problems with respiration noted  Abdomen: Soft, gravid, appropriate for gestational age.  Pain/Pressure: Absent     Pelvic: Cervical exam deferred        Extremities: Normal range of motion.     Mental Status: Normal mood and affect. Normal behavior. Normal judgment and thought content.   Assessment and Plan:  Pregnancy: G3P1011 at 320w1d. Supervision of high risk pregnancy, antepartum Has CS scheduled 8/18 but is interested in earlier date if possible, message to SaJaymes Graffent  2. Previous cesarean section Has repeat CS and BTS scheduled 8/18 She is unsure if she wants BTS vs IUD Reviewed that she able to make this decision up to the day of the surgery Concerns with BTS are the risk of heavier periods which is counseled is low risk and could be managed with other  methods were it to happen. She is very clear she does not want any more children.   3. Factor 5 Leiden mutation, heterozygous (HCNapier FieldPlan to start lovenox postpartum x6 weeksper MFM recommendations  4. Anxiety Monitor pp    Term labor symptoms and general obstetric precautions including but not limited to vaginal bleeding, contractions, leaking of fluid and fetal movement were reviewed in detail with the patient. Please refer to After Visit Summary for other counseling recommendations.   Return in about 1 week (around 02/11/2021) for  scheduled visit.  Future Appointments  Date Time Provider De[place]ollar Point8/15/2022  4:10 PM PrDonnamae JudeMD [facility]  02/10/2021  9:30 AM [facility] 1 [facility] None    KiCaren MacadamMD

## 2021-02-09 ENCOUNTER — Other Ambulatory Visit: Payer: Medicaid Other

## 2021-02-09 ENCOUNTER — Encounter: Payer: Medicaid Other | Admitting: Family Medicine

## 2021-02-09 NOTE — Anesthesia Preprocedure Evaluation (Addendum)
Anesthesia Evaluation  Patient identified by MRN, date of birth, ID band Patient awake    Reviewed: Allergy & Precautions, NPO status , Patient's Chart, lab work & pertinent test results  Airway Mallampati: II  TM Distance: >3 FB Neck ROM: Full    Dental no notable dental hx.    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, plt 173   Anesthesia Other Findings Hx mandible surgery 2014  Reproductive/Obstetrics (+) Pregnancy 1 prior section 2013 Desires sterility                           Anesthesia Physical Anesthesia Plan  ASA: 2  Anesthesia Plan: Spinal   Post-op Pain Management:    Induction:   PONV Risk Score and Plan: 3 and Ondansetron, Dexamethasone and Treatment may vary due to age or medical condition  Airway Management Planned: Natural Airway and Nasal Cannula  Additional Equipment: None  Intra-op Plan:   Post-operative Plan:   Informed Consent: I have reviewed the patients History and Physical, chart, labs and discussed the procedure including the risks, benefits and alternatives for the proposed anesthesia with the patient or authorized representative who has indicated his/her understanding and acceptance.     Dental advisory given  Plan Discussed with: CRNA  Anesthesia Plan Comments: (Extremely anxious in preop, requesting sedation)      Anesthesia Quick Evaluation

## 2021-02-10 ENCOUNTER — Other Ambulatory Visit: Payer: Self-pay | Admitting: Family Medicine

## 2021-02-10 ENCOUNTER — Other Ambulatory Visit: Payer: Self-pay

## 2021-02-10 ENCOUNTER — Encounter (HOSPITAL_COMMUNITY)
Admission: RE | Admit: 2021-02-10 | Discharge: 2021-02-10 | Disposition: A | Discharge status: Home / Self Care | Payer: Medicaid Other | Source: Ambulatory Visit | Attending: Family Medicine | Admitting: Family Medicine

## 2021-02-10 DIAGNOSIS — Z01812 Encounter for preprocedural laboratory examination: Secondary | ICD-10-CM | POA: Diagnosis not present

## 2021-02-10 LAB — CBC
HCT: 35.1 % — ABNORMAL LOW (ref 36.0–46.0)
Hemoglobin: 11.9 g/dL — ABNORMAL LOW (ref 12.0–15.0)
MCH: 32.2 pg (ref 26.0–34.0)
MCHC: 33.9 g/dL (ref 30.0–36.0)
MCV: 94.9 fL (ref 80.0–100.0)
Platelets: 173 10*3/uL (ref 150–400)
RBC: 3.7 MIL/uL — ABNORMAL LOW (ref 3.87–5.11)
RDW: 13.3 % (ref 11.5–15.5)
WBC: 8.1 10*3/uL (ref 4.0–10.5)
nRBC: 0 % (ref 0.0–0.2)

## 2021-02-10 LAB — TYPE AND SCREEN
ABO/RH(D): O POS
Antibody Screen: NEGATIVE

## 2021-02-11 LAB — SARS CORONAVIRUS 2 (TAT 6-24 HRS): SARS Coronavirus 2: NEGATIVE

## 2021-02-11 LAB — RPR: RPR Ser Ql: NONREACTIVE

## 2021-02-12 ENCOUNTER — Inpatient Hospital Stay (HOSPITAL_COMMUNITY): Payer: Medicaid Other | Admitting: Anesthesiology

## 2021-02-12 ENCOUNTER — Inpatient Hospital Stay (HOSPITAL_COMMUNITY)
Admission: RE | Admit: 2021-02-12 | Discharge: 2021-02-15 | DRG: 784 | Disposition: A | Discharge status: Home / Self Care | Payer: Medicaid Other | Attending: Family Medicine | Admitting: Family Medicine

## 2021-02-12 ENCOUNTER — Other Ambulatory Visit: Payer: Self-pay

## 2021-02-12 ENCOUNTER — Encounter (HOSPITAL_COMMUNITY): Payer: Self-pay | Admitting: Family Medicine

## 2021-02-12 ENCOUNTER — Encounter (HOSPITAL_COMMUNITY)
Admission: RE | Disposition: A | Discharge status: Home / Self Care | Payer: Self-pay | Source: Home / Self Care | Attending: Family Medicine

## 2021-02-12 DIAGNOSIS — O0990 Supervision of high risk pregnancy, unspecified, unspecified trimester: Secondary | ICD-10-CM

## 2021-02-12 DIAGNOSIS — O9912 Other diseases of the blood and blood-forming organs and certain disorders involving the immune mechanism complicating childbirth: Secondary | ICD-10-CM | POA: Diagnosis not present

## 2021-02-12 DIAGNOSIS — Z3A39 39 weeks gestation of pregnancy: Secondary | ICD-10-CM

## 2021-02-12 DIAGNOSIS — O99214 Obesity complicating childbirth: Secondary | ICD-10-CM | POA: Diagnosis not present

## 2021-02-12 DIAGNOSIS — Z98891 History of uterine scar from previous surgery: Secondary | ICD-10-CM

## 2021-02-12 DIAGNOSIS — Z302 Encounter for sterilization: Secondary | ICD-10-CM

## 2021-02-12 DIAGNOSIS — O34211 Maternal care for low transverse scar from previous cesarean delivery: Secondary | ICD-10-CM | POA: Diagnosis not present

## 2021-02-12 DIAGNOSIS — D6851 Activated protein C resistance: Secondary | ICD-10-CM | POA: Diagnosis present

## 2021-02-12 DIAGNOSIS — Z3480 Encounter for supervision of other normal pregnancy, unspecified trimester: Secondary | ICD-10-CM

## 2021-02-12 LAB — CBC
HCT: 33.5 % — ABNORMAL LOW (ref 36.0–46.0)
Hemoglobin: 11.6 g/dL — ABNORMAL LOW (ref 12.0–15.0)
MCH: 32.2 pg (ref 26.0–34.0)
MCHC: 34.6 g/dL (ref 30.0–36.0)
MCV: 93.1 fL (ref 80.0–100.0)
Platelets: 163 10*3/uL (ref 150–400)
RBC: 3.6 MIL/uL — ABNORMAL LOW (ref 3.87–5.11)
RDW: 13.1 % (ref 11.5–15.5)
WBC: 17.5 10*3/uL — ABNORMAL HIGH (ref 4.0–10.5)
nRBC: 0 % (ref 0.0–0.2)

## 2021-02-12 SURGERY — Surgical Case
Anesthesia: Spinal | Laterality: Bilateral | Wound class: Clean Contaminated

## 2021-02-12 MED ORDER — COCONUT OIL OIL
1.0000 "application " | TOPICAL_OIL | Status: DC | PRN
  Administered 2021-02-13: 1 via TOPICAL

## 2021-02-12 MED ORDER — MENTHOL 3 MG MT LOZG: 1.0000 | LOZENGE | OROMUCOSAL | Status: DC | PRN

## 2021-02-12 MED ORDER — PHENYLEPHRINE 40 MCG/ML (10ML) SYRINGE FOR IV PUSH (FOR BLOOD PRESSURE SUPPORT)
PREFILLED_SYRINGE | INTRAVENOUS | Status: AC
  Filled 2021-02-12: qty 10

## 2021-02-12 MED ORDER — NALBUPHINE HCL 10 MG/ML IJ SOLN
5.0000 mg | Freq: Once | INTRAMUSCULAR | Status: DC | PRN
Start: 2021-02-12 — End: 2021-02-15

## 2021-02-12 MED ORDER — STERILE WATER FOR IRRIGATION IR SOLN
Status: DC | PRN
  Administered 2021-02-12: 1000 mL

## 2021-02-12 MED ORDER — KETOROLAC TROMETHAMINE 30 MG/ML IJ SOLN
INTRAMUSCULAR | Status: DC | PRN
  Administered 2021-02-12: 30 mg via INTRAVENOUS

## 2021-02-12 MED ORDER — PRENATAL MULTIVITAMIN CH
1.0000 | ORAL_TABLET | Freq: Every day | ORAL | Status: DC
  Administered 2021-02-13 – 2021-02-15 (×3): 1 via ORAL
  Filled 2021-02-12 (×3): qty 1

## 2021-02-12 MED ORDER — NALOXONE HCL 4 MG/10ML IJ SOLN
1.0000 ug/kg/h | INTRAVENOUS | Status: DC | PRN
  Filled 2021-02-12: qty 5

## 2021-02-12 MED ORDER — FENTANYL CITRATE (PF) 100 MCG/2ML IJ SOLN
INTRAMUSCULAR | Status: AC
  Filled 2021-02-12: qty 2

## 2021-02-12 MED ORDER — SODIUM CHLORIDE 0.9% FLUSH: 3.0000 mL | INTRAVENOUS | Status: DC | PRN

## 2021-02-12 MED ORDER — SODIUM CHLORIDE 0.9 % IR SOLN
Status: DC | PRN
  Administered 2021-02-12: 1000 mL

## 2021-02-12 MED ORDER — DIPHENHYDRAMINE HCL 25 MG PO CAPS: 25.0000 mg | ORAL_CAPSULE | ORAL | Status: DC | PRN

## 2021-02-12 MED ORDER — OXYTOCIN-SODIUM CHLORIDE 30-0.9 UT/500ML-% IV SOLN
INTRAVENOUS | Status: AC
  Filled 2021-02-12: qty 500

## 2021-02-12 MED ORDER — MORPHINE SULFATE (PF) 0.5 MG/ML IJ SOLN
INTRAMUSCULAR | Status: DC | PRN
  Administered 2021-02-12: .15 ug via INTRATHECAL

## 2021-02-12 MED ORDER — FENTANYL CITRATE (PF) 100 MCG/2ML IJ SOLN
INTRAMUSCULAR | Status: DC | PRN
  Administered 2021-02-12: 15 ug via INTRATHECAL

## 2021-02-12 MED ORDER — OXYTOCIN-SODIUM CHLORIDE 30-0.9 UT/500ML-% IV SOLN: 2.5000 [IU]/h | INTRAVENOUS | Status: AC

## 2021-02-12 MED ORDER — SOD CITRATE-CITRIC ACID 500-334 MG/5ML PO SOLN
ORAL | Status: AC
  Filled 2021-02-12: qty 30

## 2021-02-12 MED ORDER — KETOROLAC TROMETHAMINE 30 MG/ML IJ SOLN: 30.0000 mg | Freq: Four times a day (QID) | INTRAMUSCULAR | Status: AC | PRN

## 2021-02-12 MED ORDER — NALBUPHINE HCL 10 MG/ML IJ SOLN: 5.0000 mg | INTRAMUSCULAR | Status: DC | PRN

## 2021-02-12 MED ORDER — SIMETHICONE 80 MG PO CHEW: 80.0000 mg | CHEWABLE_TABLET | ORAL | Status: DC | PRN

## 2021-02-12 MED ORDER — SOD CITRATE-CITRIC ACID 500-334 MG/5ML PO SOLN
30.0000 mL | ORAL | Status: AC
  Administered 2021-02-12: 30 mL via ORAL

## 2021-02-12 MED ORDER — CEFAZOLIN SODIUM-DEXTROSE 2-4 GM/100ML-% IV SOLN
INTRAVENOUS | Status: AC
  Filled 2021-02-12: qty 100

## 2021-02-12 MED ORDER — ENOXAPARIN SODIUM 40 MG/0.4ML IJ SOSY
40.0000 mg | PREFILLED_SYRINGE | INTRAMUSCULAR | Status: DC
  Administered 2021-02-14 – 2021-02-15 (×2): 40 mg via SUBCUTANEOUS
  Filled 2021-02-12 (×2): qty 0.4

## 2021-02-12 MED ORDER — ONDANSETRON HCL 4 MG/2ML IJ SOLN
4.0000 mg | Freq: Three times a day (TID) | INTRAMUSCULAR | Status: DC | PRN
Start: 2021-02-12 — End: 2021-02-14

## 2021-02-12 MED ORDER — MIDAZOLAM HCL 2 MG/2ML IJ SOLN
INTRAMUSCULAR | Status: DC | PRN
  Administered 2021-02-12: 1 mg via INTRAVENOUS

## 2021-02-12 MED ORDER — BUPIVACAINE IN DEXTROSE 0.75-8.25 % IT SOLN
INTRATHECAL | Status: DC | PRN
  Administered 2021-02-12: 1.8 mL via INTRATHECAL

## 2021-02-12 MED ORDER — DEXAMETHASONE SODIUM PHOSPHATE 10 MG/ML IJ SOLN
INTRAMUSCULAR | Status: DC | PRN
  Administered 2021-02-12: 4 mg via INTRAVENOUS

## 2021-02-12 MED ORDER — NALOXONE HCL 0.4 MG/ML IJ SOLN: 0.4000 mg | INTRAMUSCULAR | Status: DC | PRN

## 2021-02-12 MED ORDER — ACETAMINOPHEN 10 MG/ML IV SOLN
INTRAVENOUS | Status: AC
  Filled 2021-02-12: qty 100

## 2021-02-12 MED ORDER — SIMETHICONE 80 MG PO CHEW
80.0000 mg | CHEWABLE_TABLET | Freq: Three times a day (TID) | ORAL | Status: DC
  Administered 2021-02-13 – 2021-02-15 (×7): 80 mg via ORAL
  Filled 2021-02-12 (×7): qty 1

## 2021-02-12 MED ORDER — SCOPOLAMINE 1 MG/3DAYS TD PT72: 1.0000 | MEDICATED_PATCH | Freq: Once | TRANSDERMAL | Status: DC

## 2021-02-12 MED ORDER — TETANUS-DIPHTH-ACELL PERTUSSIS 5-2.5-18.5 LF-MCG/0.5 IM SUSY: 0.5000 mL | PREFILLED_SYRINGE | Freq: Once | INTRAMUSCULAR | Status: DC

## 2021-02-12 MED ORDER — PHENYLEPHRINE HCL-NACL 20-0.9 MG/250ML-% IV SOLN
INTRAVENOUS | Status: DC | PRN
  Administered 2021-02-12: 60 ug/min via INTRAVENOUS

## 2021-02-12 MED ORDER — MIDAZOLAM HCL 2 MG/2ML IJ SOLN
INTRAMUSCULAR | Status: AC
  Filled 2021-02-12: qty 2

## 2021-02-12 MED ORDER — LACTATED RINGERS IV SOLN: INTRAVENOUS | Status: DC

## 2021-02-12 MED ORDER — DEXMEDETOMIDINE (PRECEDEX) IN NS 20 MCG/5ML (4 MCG/ML) IV SYRINGE
PREFILLED_SYRINGE | INTRAVENOUS | Status: DC | PRN
  Administered 2021-02-12: 12 ug via INTRAVENOUS

## 2021-02-12 MED ORDER — DEXAMETHASONE SODIUM PHOSPHATE 4 MG/ML IJ SOLN
INTRAMUSCULAR | Status: AC
  Filled 2021-02-12: qty 1

## 2021-02-12 MED ORDER — PHENYLEPHRINE HCL-NACL 20-0.9 MG/250ML-% IV SOLN
INTRAVENOUS | Status: AC
  Filled 2021-02-12: qty 250

## 2021-02-12 MED ORDER — OXYCODONE HCL 5 MG PO TABS
5.0000 mg | ORAL_TABLET | ORAL | Status: DC | PRN
  Administered 2021-02-12: 10 mg via ORAL
  Administered 2021-02-13: 5 mg via ORAL
  Administered 2021-02-13 (×3): 10 mg via ORAL
  Administered 2021-02-14 (×2): 5 mg via ORAL
  Administered 2021-02-14 – 2021-02-15 (×7): 10 mg via ORAL
  Filled 2021-02-12: qty 1
  Filled 2021-02-12 (×4): qty 2
  Filled 2021-02-12: qty 1
  Filled 2021-02-12 (×7): qty 2
  Filled 2021-02-12: qty 1
  Filled 2021-02-12: qty 2

## 2021-02-12 MED ORDER — ACETAMINOPHEN 500 MG PO TABS
1000.0000 mg | ORAL_TABLET | Freq: Four times a day (QID) | ORAL | Status: AC
Start: 2021-02-12 — End: 2021-02-13
  Administered 2021-02-12 – 2021-02-13 (×4): 1000 mg via ORAL
  Filled 2021-02-12 (×4): qty 2

## 2021-02-12 MED ORDER — DIPHENHYDRAMINE HCL 50 MG/ML IJ SOLN: 12.5000 mg | INTRAMUSCULAR | Status: DC | PRN

## 2021-02-12 MED ORDER — ONDANSETRON HCL 4 MG/2ML IJ SOLN
INTRAMUSCULAR | Status: AC
  Filled 2021-02-12: qty 2

## 2021-02-12 MED ORDER — SENNOSIDES-DOCUSATE SODIUM 8.6-50 MG PO TABS
2.0000 | ORAL_TABLET | Freq: Every day | ORAL | Status: DC
  Administered 2021-02-13 – 2021-02-15 (×3): 2 via ORAL
  Filled 2021-02-12 (×3): qty 2

## 2021-02-12 MED ORDER — NALBUPHINE HCL 10 MG/ML IJ SOLN: 5.0000 mg | Freq: Once | INTRAMUSCULAR | Status: DC | PRN

## 2021-02-12 MED ORDER — ONDANSETRON HCL 4 MG/2ML IJ SOLN
INTRAMUSCULAR | Status: DC | PRN
Start: 2021-02-12 — End: 2021-02-12
  Administered 2021-02-12: 4 mg via INTRAVENOUS

## 2021-02-12 MED ORDER — MORPHINE SULFATE (PF) 0.5 MG/ML IJ SOLN
INTRAMUSCULAR | Status: AC
  Filled 2021-02-12: qty 10

## 2021-02-12 MED ORDER — OXYTOCIN-SODIUM CHLORIDE 30-0.9 UT/500ML-% IV SOLN
INTRAVENOUS | Status: DC | PRN
  Administered 2021-02-12 (×2): 30 [IU] via INTRAVENOUS

## 2021-02-12 MED ORDER — WITCH HAZEL-GLYCERIN EX PADS: 1.0000 "application " | MEDICATED_PAD | CUTANEOUS | Status: DC | PRN

## 2021-02-12 MED ORDER — ZOLPIDEM TARTRATE 5 MG PO TABS: 5.0000 mg | ORAL_TABLET | Freq: Every evening | ORAL | Status: DC | PRN

## 2021-02-12 MED ORDER — ACETAMINOPHEN 10 MG/ML IV SOLN
INTRAVENOUS | Status: DC | PRN
  Administered 2021-02-12: 1000 mg via INTRAVENOUS

## 2021-02-12 MED ORDER — KETOROLAC TROMETHAMINE 30 MG/ML IJ SOLN
30.0000 mg | Freq: Four times a day (QID) | INTRAMUSCULAR | Status: AC | PRN
  Administered 2021-02-12: 30 mg via INTRAVENOUS
  Filled 2021-02-12: qty 1

## 2021-02-12 MED ORDER — CEFAZOLIN SODIUM-DEXTROSE 2-4 GM/100ML-% IV SOLN
2.0000 g | INTRAVENOUS | Status: AC
  Administered 2021-02-12: 2 g via INTRAVENOUS

## 2021-02-12 MED ORDER — DIPHENHYDRAMINE HCL 25 MG PO CAPS: 25.0000 mg | ORAL_CAPSULE | Freq: Four times a day (QID) | ORAL | Status: DC | PRN

## 2021-02-12 MED ORDER — DIBUCAINE (PERIANAL) 1 % EX OINT: 1.0000 "application " | TOPICAL_OINTMENT | CUTANEOUS | Status: DC | PRN

## 2021-02-12 SURGICAL SUPPLY — 32 items
BENZOIN TINCTURE PRP APPL 2/3 (GAUZE/BANDAGES/DRESSINGS) ×2 IMPLANT
CANISTER SUCT 3000ML PPV (MISCELLANEOUS) ×2 IMPLANT
CHLORAPREP W/TINT 26ML (MISCELLANEOUS) ×2 IMPLANT
CLAMP CORD UMBIL (MISCELLANEOUS) ×2 IMPLANT
CLIP FILSHIE TUBAL LIGA STRL (Clip) ×5 IMPLANT
CLOTH BEACON ORANGE TIMEOUT ST (SAFETY) ×2 IMPLANT
DRAPE C SECTION CLR SCREEN (DRAPES) ×2 IMPLANT
DRAPE SHEET LG 3/4 BI-LAMINATE (DRAPES) ×2 IMPLANT
DRSG OPSITE POSTOP 4X10 (GAUZE/BANDAGES/DRESSINGS) ×2 IMPLANT
ELECT REM PT RETURN 9FT ADLT (ELECTROSURGICAL) ×2
ELECTRODE REM PT RTRN 9FT ADLT (ELECTROSURGICAL) ×1 IMPLANT
GAUZE SPONGE 4X4 8PLY STR LF (GAUZE/BANDAGES/DRESSINGS) ×4 IMPLANT
GLOVE BIOGEL PI IND STRL 7.0 (GLOVE) ×3 IMPLANT
GLOVE BIOGEL PI INDICATOR 7.0 (GLOVE) ×3
GLOVE ECLIPSE 6.5 STRL STRAW (GLOVE) ×2 IMPLANT
GOWN STRL REUS W/ TWL LRG LVL3 (GOWN DISPOSABLE) ×2 IMPLANT
GOWN STRL REUS W/TWL LRG LVL3 (GOWN DISPOSABLE) ×2
NS IRRIG 1000ML POUR BTL (IV SOLUTION) ×2 IMPLANT
PAD ABD 8X10 STRL (GAUZE/BANDAGES/DRESSINGS) ×2 IMPLANT
PAD OB MATERNITY 4.3X12.25 (PERSONAL CARE ITEMS) ×2 IMPLANT
PAD PREP 24X48 CUFFED NSTRL (MISCELLANEOUS) ×2 IMPLANT
RETRACTOR WND ALEXIS 25 LRG (MISCELLANEOUS) IMPLANT
RTRCTR WOUND ALEXIS 25CM LRG (MISCELLANEOUS)
STRIP CLOSURE SKIN 1/2X4 (GAUZE/BANDAGES/DRESSINGS) ×2 IMPLANT
SUT PLAIN 2 0 XLH (SUTURE) ×2 IMPLANT
SUT VIC AB 0 CT1 36 (SUTURE) ×6 IMPLANT
SUT VIC AB 2-0 CT1 27 (SUTURE) ×1
SUT VIC AB 2-0 CT1 TAPERPNT 27 (SUTURE) ×1 IMPLANT
SUT VIC AB 4-0 KS 27 (SUTURE) ×2 IMPLANT
TOWEL OR 17X24 6PK STRL BLUE (TOWEL DISPOSABLE) ×6 IMPLANT
TRAY FOLEY CATH SILVER 16FR (SET/KITS/TRAYS/PACK) ×2 IMPLANT
WATER STERILE IRR 1000ML POUR (IV SOLUTION) ×2 IMPLANT

## 2021-02-12 NOTE — Op Note (Addendum)
Operative Note   Patient: Cynthia Davidson  Date of Procedure: 02/12/2021  Procedure: Repeat Low Transverse Cesarean   Indications:  Scheduled repeat rLTCS  Pre-operative Diagnosis: RCS, undesired fertility.   Post-operative Diagnosis: Same.  Mild adhesive disease but uterus was free from abdominal wall.  S/P BTS with Filshie clips (per patient request)    Surgeon: Surgeon(s) and Role:    * Caren Macadam, MD - Primary    * Renard Matter, MD - Assisting   An experienced assistant was required given the standard of surgical care given the complexity of the case.  This assistant was needed for exposure, dissection, suctioning, retraction, instrument exchange, assisting with delivery with administration of fundal pressure, and for overall help during the procedure.   Anesthesia: spinal  Antibiotics: Cefazolin   Estimated Blood Loss: 386 ml   Total IV Fluids: 2500 ml  Urine Output:  236m  Specimens:  None  Complications: no complications   Indications: Cynthia MAGAis a 35y.o. [id number] an IUP 33w2dresenting for scheduled cesarean secondary to the indications listed above.   Findings: Viable infant in cephalic presentation, no nuchal cord present. Apgars 9 , 9 , . Weight 3630 g . Clear amniotic fluid. Normal placenta, three vessel cord. Normal uterus, Normal bilateral fallopian tubes, Normal bilateral ovaries.  Procedure Details: A Time Out was held and the above information confirmed. The patient received intravenous antibiotics and had sequential compression devices applied to her lower extremities preoperatively. The patient was taken back to the operative suite where spinal anesthesia was administered. After induction of anesthesia, the patient was draped and prepped in the usual sterile manner and placed in a dorsal supine position with a leftward tilt. A low transverse skin incision was made with scalpel and carried down through the subcutaneous  tissue to the fascia. Fascial incision was made and extended transversely. The fascia was separated from the underlying rectus tissue superiorly and inferiorly. The rectus muscles were separated in the midline bluntly and the peritoneum was entered bluntly. An Alexis retractor was placed to aid in visualization of the uterus. The utero-vesical peritoneal reflection was incised transversely and the bladder flap was bluntly freed from the lower uterine segment. A low transverse uterine incision was made. The infant was successfully delivered from cephalic presentation, the umbilical cord was clamped after 1 minute. Cord ph was not sent, and cord blood was obtained for evaluation. The placenta was removed Intact and appeared normal. The uterine incision was closed with running locked sutures of 0-Vicryl, and then a second imbricating layer was also placed with 0-Vicryl. Overall, excellent hemostasis was noted.   Attention was then turned to the fallopian tubes. Filshie: The left Fallopian tube was identified by tracing out to the fimbraie, grasped with the Babcock clamps. An avascular midsection of the tube approximately 3-4cm from the cornua was grasped with the babcock clamps and the filshie clip was applied, taking care to incorporate the entire tube.  Attention was then turned to the right fallopian tube after confirmation by tracing the tube out to the fimbriae. The same procedure was then performed on the right Fallopian tube, with excellent hemostasis was noted from both BTL sites.  The abdomen and the pelvis were cleared of all clot and debris and the AlUbaldo Glassingas removed. Hemostasis was confirmed on all surfaces.  The peritoneum was reapproximated using 2-0 vicryl . The fascia was then closed using 0 Vicryl in a running fashion. The subcutaneous layer was reapproximated with  plain gut and the skin was closed with a 4-0 vicryl subcuticular stitch. The patient tolerated the procedure well. Sponge, lap,  instrument and needle counts were correct x 2. She was taken to the recovery room in stable condition.  Disposition: PACU - hemodynamically stable.    Signed: Renard Matter, MD, MPH OB Fellow, Faculty Practice

## 2021-02-12 NOTE — Anesthesia Procedure Notes (Signed)
Spinal  Patient location during procedure: OR Start time: 02/12/2021 9:36 AM End time: 02/12/2021 9:39 AM Reason for block: surgical anesthesia Staffing Performed: anesthesiologist  Anesthesiologist: Pervis Hocking, DO Preanesthetic Checklist Completed: patient identified, IV checked, risks and benefits discussed, surgical consent, monitors and equipment checked, pre-op evaluation and timeout performed Spinal Block Patient position: sitting Prep: DuraPrep and site prepped and draped Patient monitoring: cardiac monitor, continuous pulse ox and blood pressure Approach: midline Location: L3-4 Injection technique: single-shot Needle Needle type: Pencan  Needle gauge: 24 G Needle length: 9 cm Assessment Sensory level: T6 Events: CSF return Additional Notes Functioning IV was confirmed and monitors were applied. Sterile prep and drape, including hand hygiene and sterile gloves were used. The patient was positioned and the spine was prepped. The skin was anesthetized with lidocaine.  Free flow of clear CSF was obtained prior to injecting local anesthetic into the CSF.  The spinal needle aspirated freely following injection.  The needle was carefully withdrawn.  The patient tolerated the procedure well.

## 2021-02-12 NOTE — H&P (Signed)
OBSTETRIC ADMISSION HISTORY AND PHYSICAL  Cynthia Davidson is a 35 y.o. female G4P1011 with IUP at 60w2dby LMP presenting for scheduled rLTCS. She reports +FMs, No LOF, no VB, no blurry vision, headaches or peripheral edema, and RUQ pain.  She plans on breast and formula feeding. She request Filshie clips for birth control.  [facility]   Dating: By LMP --->  Estimated Date of Delivery: 02/17/21  Sono:   '@[redacted]w[redacted]d'$ , CWD, normal anatomy, breech presentation (on 5/31), placental lie fundal, 2259g, 35% EFW   Prenatal History/Complications:  Factor V Leiden mutation- will start lovenox pp per MFM recommendations  Past Medical History: Past Medical History:  Diagnosis Date   ADD (attention deficit disorder) 09/11/2016   Anxiety attack    [place]   [place]   Guttate psoriasis    Mastitis, associated with childbirth 02/2012   required hospitalization and IV antibiotics   Panic attack     Past Surgical History: Past Surgical History:  Procedure Laterality Date   BIOPSY BREAST Bilateral 2008   CESAREAN SECTION  02/26/2012   active phase arrest (FTP)   MANDIBLE SURGERY  2014   for TMJ   SINUS IRRIGATION     TONSILLECTOMY     WISDOM TOOTH EXTRACTION      Obstetrical History: OB History     Gravida  3   Para  1   Term  1   Preterm  0   AB  1   Living  1      SAB  0   IAB  1   Ectopic  0   Multiple  0   Live Births  1           Social History Social History   Socioeconomic History   Marital status: Single    Spouse name: Not on file   Number of children: 1   Years of education: Not on file   Highest education level: Not on file  Occupational History   Occupation: LPN    Comment: works at [employer]   Smoking status: Never   Smokeless tobacco: Never  Vaping Use   Vaping Use: Never used  Substance and Sexual Activity   Alcohol use:  Yes    Comment: occasional   Drug use: No   Sexual activity: Not Currently  Other Topics Concern   Not on file  Social History Narrative   Not on file   Social Determinants of Health   Financial Resource Strain: Not on file  Food Insecurity: Not on file  Transportation Needs: Not on file  Physical Activity: Not on file  Stress: Not on file  Social Connections: Not on file    Family History: Family History  Problem Relation Age of Onset   Stroke Maternal Uncle    Factor V Leiden deficiency Maternal Uncle    Stroke Paternal Aunt    Factor V Leiden deficiency Paternal Aunt    Stroke Maternal Grandfather    Melanoma Paternal Grandmother    Cerebral aneurysm Paternal Grandmother    Heart disease Paternal Grandfather    Heart attack Paternal Grandfather    Aortic aneurysm Paternal Grandfather     Allergies: Allergies  Allergen Reactions   Doxycycline Swelling and Anaphylaxis   Morphine Nausea Only    Medications Prior to Admission  Medication Sig Dispense Refill Last Dose   Cholecalciferol (VITAMIN D-3)  125 MCG (5000 UT) TABS Take 5,000 mg by mouth daily as needed (random).      Prenatal Vit-Fe Fumarate-FA (PRENATAL MULTIVITAMIN) TABS tablet Take 1 tablet by mouth daily at 12 noon.      cyclobenzaprine (FLEXERIL) 5 MG tablet Take 1 tablet (5 mg total) by mouth every 8 (eight) hours as needed for muscle spasms (Take 1-2 tablest PRN 8 hours for back pain). (Patient not taking: No sig reported) 30 tablet 1 Not Taking     Review of Systems   All systems reviewed and negative except as stated in HPI  Last menstrual period 05/13/2020. General appearance: alert Lungs: clear to auscultation bilaterally Heart: regular rate and rhythm Abdomen: soft, non-tender; bowel sounds normal Extremities: Homans sign is negative, no sign of DVT DTR's WNL Presentation: cephalic Incision: well healed c-section scar FHT: 130   Prenatal labs: ABO, Rh: --/--/O POS (08/16  1026) Antibody: NEG (08/16 1026) Rubella:  immune RPR: NON REACTIVE (08/16 1022)  HBsAg:   negative HIV: Non Reactive (06/23 0917)  GBS: Negative/-- (08/04 1630)  2 hr Glucola normal Genetic screening  negative Anatomy US normal  Prenatal Transfer Tool  Maternal Diabetes: No Genetic Screening: Normal Maternal Ultrasounds/Referrals: Normal Fetal Ultrasounds or other Referrals:  None Maternal Substance Abuse:  No Significant Maternal Medications:  None Significant Maternal Lab Results: Group B Strep negative  No results found for this or any previous visit (from the past 24 hour(s)).  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 G3P1011 at 87w2dhere for scheduled repeat LTCS  #Scheduled rLTCS The risks of cesarean section were discussed with the patient including but were not limited to: bleeding which may require transfusion or reoperation; infection which may require antibiotics; injury to bowel, bladder, ureters or other surrounding organs; injury to the fetus; need for additional procedures including hysterectomy in the event of a life-threatening hemorrhage; placental abnormalities wth subsequent pregnancies, incisional problems, thromboembolic phenomenon and other postoperative/anesthesia complications.  Patient also desires permanent sterilization.  Other reversible forms of contraception were discussed with patient; she declines all other modalities. Risks of procedure discussed with patient including but not limited to: risk of regret, permanence of method, bleeding, infection, injury to surrounding organs and need for additional procedures.  Failure risk of about 1% with increased risk of ectopic  gestation if pregnancy occurs was also discussed with patient.  Also discussed possibility of post-tubal pain syndrome. The patient concurred with the proposed plan, giving informed written consent for the procedures.  Patient has been NPO since midnight she will remain NPO for procedure. Anesthesia and OR aware.  Preoperative prophylactic antibiotics and SCDs ordered on call to the OR.  To OR when ready.  #Pain: spinal #FWB: Reassuring. Discussed options post delivery for standard infant care. Declined Hep B, Declined erythromycin eye ointment. Accepts Vitamin K #ID:  GBS negative #MOF: breast- does have history of oversupply with mastitis and hospitlalization postpartum. Consult to lactation needed and [facility] #MOC: Patient was unsure about BTS- we reviewed all the option including salpingectomy, pomeroy and Filshie. Desires Filshie. Declined salpingectomy and was counseled on salpingectomy conferring reduced ovarian cancer risk #Circ:  Unsure of whether she wants this in patient or outpatient. Will  decide and tell team. Encouraged patient to have infant receive vitamin K and she agreed. Dr.Gissell Barra consented and will place note in infant chart after delivery.   # History of PPD: close monitoring postpartum. Recommend 1-2 week mood check outpatient and low threshold to start medication. Discussed with patient prenatally. Try to schedule with Dr. Ernestina Patches to also assess breastfeeding at that time given history of oversupply.   Renard Matter, MD, MPH OB Fellow, Faculty Practice  Caren Macadam, MD, MPH, ABFM, [facility] Attending Physician Center for [facility]

## 2021-02-12 NOTE — Transfer of Care (Signed)
Immediate Anesthesia Transfer of Care Note  Patient: Cynthia Davidson  Procedure(s) Performed: CESAREAN SECTION WITH BILATERAL TUBAL LIGATION (Bilateral)  Patient Location: PACU  Anesthesia Type:Spinal  Level of Consciousness: awake, alert  and oriented  Airway & Oxygen Therapy: Patient Spontanous Breathing  Post-op Assessment: Report given to RN and Post -op Vital signs reviewed and stable  Post vital signs: Reviewed and stable  Last Vitals:  Vitals Value Taken Time  BP 95/69 02/12/21 1115  Temp    Pulse 99 02/12/21 1116  Resp 19 02/12/21 1116  SpO2 99 % 02/12/21 1116  Vitals shown include unvalidated device data.  Last Pain:  Vitals:   02/12/21 0758  TempSrc: Oral         Complications: No notable events documented.

## 2021-02-12 NOTE — Anesthesia Postprocedure Evaluation (Signed)
Anesthesia Post Note  Patient: Cynthia Davidson  Procedure(s) Performed: CESAREAN SECTION WITH BILATERAL TUBAL LIGATION (Bilateral)     Patient location during evaluation: PACU Anesthesia Type: Spinal Level of consciousness: awake and alert and oriented Pain management: pain level controlled Vital Signs Assessment: post-procedure vital signs reviewed and stable Respiratory status: spontaneous breathing, nonlabored ventilation and respiratory function stable Cardiovascular status: blood pressure returned to baseline and stable Postop Assessment: no headache, no backache, spinal receding, patient able to bend at knees and no apparent nausea or vomiting Anesthetic complications: no   No notable events documented.  Last Vitals:  Vitals:   02/12/21 1148 02/12/21 1200  BP: 96/61 94/68  Pulse: 85 94  Resp: 12 (!) 21  Temp:    SpO2: 99% 99%    Last Pain:  Vitals:   02/12/21 1148  TempSrc:   PainSc: 0-No pain   Pain Goal:                Epidural/Spinal Function Cutaneous sensation: Tingles (02/12/21 1215), Patient able to flex knees: Yes (02/12/21 1215), Patient able to lift hips off bed: No (02/12/21 1215), Back pain beyond tenderness at insertion site: No (02/12/21 1215), Progressively worsening motor and/or sensory loss: No (02/12/21 1215), Bowel and/or bladder incontinence post epidural: No (02/12/21 1215)  Pervis Hocking

## 2021-02-12 NOTE — Discharge Summary (Signed)
Postpartum Discharge Summary  Date of Service updated     Patient Name: Cynthia Davidson DOB: 02-09-1986 MRN: [id number]  Date of admission: 02/12/2021 Delivery date:02/12/2021  Delivering provider: Caren Macadam  Date of discharge: 02/15/2021  Admitting diagnosis: S/P repeat low transverse C-section [Z98.891] Supervision of other normal pregnancy, antepartum [Z34.80] Intrauterine pregnancy: [redacted]w[redacted]d    Secondary diagnosis:  Active Problems:   Factor 5 Leiden mutation, heterozygous ([facility]   Supervision of high risk pregnancy, antepartum   Status post repeat low transverse cesarean section   Supervision of other normal pregnancy, antepartum  Additional problems: None    Discharge diagnosis: Term Pregnancy Delivered                                              Post partum procedures:postpartum tubal ligation Complications: None  Hospital course: Sceduled C/S   35y.o. yo [id number] 363w2das admitted to the hospital 02/12/2021 for scheduled cesarean section with the following indication: scheduled repeat .Delivery details are as follows:  Membrane Rupture Time/Date: 10:10 AM ,02/12/2021   Delivery Method:C-Section, Low Transverse   Details of operation can be found in separate operative note.  Patient had an uncomplicated postpartum course.  She is ambulating, tolerating a regular diet, passing flatus, and urinating well. She has a history of Factor 5 Leiden mutation, she was discharged home on lovenox for 6 weeks per MFM recommendation. Patient is discharged home in stable condition on  02/15/21        Newborn Data: Birth date:02/12/2021  Birth time:10:11 AM  Gender:Female  Living status:Living  Apgars:9 ,9  Weight:3630 g     Magnesium Sulfate received: No BMZ received: No Rhophylac:N/A MMR:N/A T-DaP: Offered postpartum, not given.  Flu: N/A Transfusion:No  Physical exam  Vitals:   02/14/21 0545 02/14/21 1600 02/14/21 2015 02/15/21 0509  BP: 116/78 108/63  114/79 106/77  Pulse: 98 96 80 75  Resp: 18 18 18 18   Temp: 98 F (36.7 C) 98.3 F (36.8 C) 97.6 F (36.4 C) 98.3 F (36.8 C)  TempSrc: Oral Oral Oral Oral  SpO2: 100%  99% 98%   General: alert, cooperative, and no distress Lochia: appropriate Uterine Fundus: firm Incision: No significant erythema, Dressing is clean, dry, and intact. Two honeycombs are in place.  DVT Evaluation: No evidence of DVT seen on physical exam. No significant calf/ankle edema. Labs: Lab Results  Component Value Date   WBC 12.7 (H) 02/13/2021   HGB 10.7 (L) 02/13/2021   HCT 31.1 (L) 02/13/2021   MCV 93.4 02/13/2021   PLT 142 (L) 02/13/2021   CMP Latest Ref Rng & Units 10/06/2020  Glucose 70 - 99 mg/dL 96  BUN 6 - 20 mg/dL 8  Creatinine 0.44 - 1.00 mg/dL 0.62  Sodium 135 - 145 mmol/L 135  Potassium 3.5 - 5.1 mmol/L 3.5  Chloride 98 - 111 mmol/L 103  CO2 22 - 32 mmol/L 25  Calcium 8.9 - 10.3 mg/dL 8.7(L)  Total Protein 6.5 - 8.1 g/dL -  Total Bilirubin 0.3 - 1.2 mg/dL -  Alkaline Phos 38 - 126 U/L -  AST 15 - 41 U/L -  ALT 14 - 54 U/L -   Edinburgh Score: Edinburgh Postnatal Depression Scale Screening Tool 02/15/2021  I have been able to laugh and see the funny side of things. 0  I have  looked forward with enjoyment to things. 0  I have blamed myself unnecessarily when things went wrong. 1  I have been anxious or worried for no good reason. 2  I have felt scared or panicky for no good reason. 1  Things have been getting on top of me. 1  I have been so unhappy that I have had difficulty sleeping. 1  I have felt sad or miserable. 1  I have been so unhappy that I have been crying. 1  The thought of harming myself has occurred to me. 0  Edinburgh Postnatal Depression Scale Total 8     After visit meds:  Allergies as of 02/15/2021       Reactions   Doxycycline Swelling, Anaphylaxis   Morphine Nausea Only        Medication List     STOP taking these medications    cyclobenzaprine 5  MG tablet Commonly known as: FLEXERIL       TAKE these medications    acetaminophen 325 MG tablet Commonly known as: TYLENOL Take 2 tablets (650 mg total) by mouth every 6 (six) hours. Notes to patient: Last today @1151    enoxaparin 40 MG/0.4ML injection Commonly known as: LOVENOX Inject 0.4 mLs (40 mg total) into the skin daily. Notes to patient: Last today @1151    ibuprofen 600 MG tablet Commonly known as: ADVIL Take 1 tablet (600 mg total) by mouth every 6 (six) hours as needed. Notes to patient: None today   ondansetron 4 MG disintegrating tablet Commonly known as: ZOFRAN-ODT Take 1 tablet (4 mg total) by mouth every 8 (eight) hours as needed for nausea or vomiting. Notes to patient: None today   oxyCODONE 5 MG immediate release tablet Commonly known as: Oxy IR/ROXICODONE Take 1-2 tablets (5-10 mg total) by mouth every 6 (six) hours as needed for severe pain. Notes to patient: Last today @9 :29   prenatal multivitamin Tabs tablet Take 1 tablet by mouth daily at 12 noon. Notes to patient: Last today @1151    Vitamin D-3 125 MCG (5000 UT) Tabs Take 5,000 mg by mouth daily as needed (random).         Discharge home in stable condition Infant Feeding: Breast Infant Disposition:home with mother Discharge instruction: per After Visit Summary and Postpartum booklet. Activity: Advance as tolerated. Pelvic rest for 6 weeks.  Diet: routine diet Future Appointments: Future Appointments  Date Time Provider [place]  02/19/2021 10:30 AM [facility] NURSE [facility]  03/17/2021  1:10 PM Aletha Halim, MD [facility]   Follow up Visit: Message sent to [facility] by Dr. Cy Blamer on 02/12/2021  Please schedule this patient for a In person postpartum visit in 4 weeks with the following provider: Any provider. Additional Postpartum F/U:Incision check 1 week  Low risk pregnancy complicated by:  None Delivery mode:  C-Section, Low Transverse   Anticipated Birth Control:  BTL done PP   Patriciaann Clan, DO

## 2021-02-13 ENCOUNTER — Other Ambulatory Visit: Payer: Medicaid Other

## 2021-02-13 LAB — BIRTH TISSUE RECOVERY COLLECTION (PLACENTA DONATION)

## 2021-02-13 LAB — CBC
HCT: 31.1 % — ABNORMAL LOW (ref 36.0–46.0)
Hemoglobin: 10.7 g/dL — ABNORMAL LOW (ref 12.0–15.0)
MCH: 32.1 pg (ref 26.0–34.0)
MCHC: 34.4 g/dL (ref 30.0–36.0)
MCV: 93.4 fL (ref 80.0–100.0)
Platelets: 142 10*3/uL — ABNORMAL LOW (ref 150–400)
RBC: 3.33 MIL/uL — ABNORMAL LOW (ref 3.87–5.11)
RDW: 13.3 % (ref 11.5–15.5)
WBC: 12.7 10*3/uL — ABNORMAL HIGH (ref 4.0–10.5)
nRBC: 0 % (ref 0.0–0.2)

## 2021-02-13 MED ORDER — IBUPROFEN 600 MG PO TABS
600.0000 mg | ORAL_TABLET | Freq: Four times a day (QID) | ORAL | Status: DC | PRN
  Administered 2021-02-13 – 2021-02-14 (×2): 600 mg via ORAL
  Filled 2021-02-13 (×2): qty 1

## 2021-02-13 MED ORDER — ACETAMINOPHEN 500 MG PO TABS
1000.0000 mg | ORAL_TABLET | Freq: Four times a day (QID) | ORAL | Status: DC | PRN
  Administered 2021-02-14: 1000 mg via ORAL
  Filled 2021-02-13 (×2): qty 2

## 2021-02-13 MED ORDER — ACETAMINOPHEN 500 MG PO TABS: 1000.0000 mg | ORAL_TABLET | Freq: Four times a day (QID) | ORAL | Status: DC | PRN

## 2021-02-13 NOTE — Lactation Note (Addendum)
This note was copied from a baby's chart. Lactation Consultation Note  Patient Name: Cynthia Davidson [id number] Date: 02/13/2021 Reason for consult: Follow-up assessment;Term Age:33 hours LC entered the room, mom was holding infant swaddled in blankets per mom, infant recently breastfeed for 15 minutes and afterwards was given 10 mls of formula with slow flow nipple at 1800 pm. LC did not observe latch at this time. Per mom, both of her breast hurt and are sore, LC observe mom has lateral nipple stripes on both breast with abrasions, mom nipples are flat. LC gave mom comfort gels to wear in her bra and mom understands not to use comfort gels with coconut oil. Mom was also given 27 mm breast flange with hand pump, mom will start pre-pumping her breast prior to latching infant to help evert nipple shaft out more. Mom will ask RN or LC for latch assistance if needed. Mom feeding choice is breast and formula feeding, mom was given feeding sheet, mom understands if she breastfeed infant Day to after latching him at the breast to offer ( 7-15 mls) per feeding, if formula only Day 2 to offer 15-30 mls per feeding.  Maternal Data    Feeding Mother's Current Feeding Choice: Breast Milk and Formula Nipple Type: Slow - flow  LATCH Score                    Lactation Tools Discussed/Used Tools: Pump;Comfort gels (Mom has abraisons and nipple stripes on both breast.) Breast pump type: Manual Pump Education: Setup, frequency, and cleaning;Milk Storage Reason for Pumping: Mom will pre-pump breast prior to latching infant due to having flat nipples. Pumping frequency: Pre-pump breast prior to latching infant.  Interventions    Discharge    Consult Status Consult Status: Follow-up Date: 02/14/21 Follow-up type: In-patient    Vicente Serene 02/13/2021, 8:59 PM

## 2021-02-13 NOTE — Lactation Note (Signed)
This note was copied from a baby's chart. Lactation Consultation Note  Patient Name: Cynthia Davidson [id number] Date: 02/13/2021 Reason for consult: Initial assessment;Term;Infant weight loss;Other (Comment) (5 % weight loss/ LC encouraged mom to call with feeding cues for feeding assessment) Age:33 hours/ P2 , experienced BF  Per mom baby was up all night cluster feeding and ended up giving formula and now he is sleeping.  LC reviewed doc flow sheets with mom/ WNL for age.   Maternal Data Does the patient have breastfeeding experience prior to this delivery?: Yes How long did the patient breastfeed?: per mom with 1st baby ( now 80 years old ), did not BF in the hospital, BF at home, and mostly pumped / mastistis with tx  Feeding Mother's Current Feeding Choice: Breast Milk and Formula  LATCH Score                    Lactation Tools Discussed/Used    Interventions    Discharge Pump: Personal [place] Program: Yes  Consult Status Consult Status: Follow-up Date: 02/13/21 Follow-up type: In-patient    [place] 02/13/2021, 8:53 AM

## 2021-02-13 NOTE — Social Work (Signed)
CSW received consult for hx of Anxiety.  CSW met with Cynthia Davidson to offer support and complete assessment.     CSW introduced self and role. CSW observed Cynthia Davidson holding infant Cynthia Davidson and FOB Cynthia Davidson present bedside. Cynthia Davidson declined to speak in private. CSW informed Cynthia Davidson of reason for consult and assessed emotions. Cynthia Davidson reported she is doing okay, but very tired. CSW expressed understanding. Cynthia Davidson went on to disclose she was diagnosed with anxiety as a child and experienced postpartum anxiety in 2013. Cynthia Davidson stated she has had some anxiety this pregnancy and managed to cope through it. Cynthia Davidson reported she has been on Klonopin since age 67, but stopped with pregnancy. Cynthia Davidson stated she still has pills, which were prescribed by her PCP. Cynthia Davidson shared she takes the Klonopin as needed and had adequate access postpartum. Cynthia Davidson stated the medication is helpful. Cynthia Davidson reported she has tried therapy in the past, however she found it to be a waste of time. CSW asked Cynthia Davidson if she has personally identified coping skills. Cynthia Davidson reported essential oils and sleep are always helpful. Cynthia Davidson identified FOB and friends as supports. Cynthia Davidson denies any current SI or HI.   CSW provided education regarding the baby blues period vs. perinatal mood disorders, providing resources.  CSW recommended self-evaluation using the New Mom Checklist.   CSW provided review of Sudden Infant Death Syndrome (SIDS) precautions. Cynthia Davidson has all infant's needs, including a bassinet. Cynthia Davidson identified [facility] for follow-up care and denies barriers. Cynthia Davidson denies having any additional needs at this time.    CSW identifies no further need for intervention and no barriers to discharge at this time.  Darra Lis, [place] [employer] [phone]

## 2021-02-13 NOTE — Progress Notes (Addendum)
Post Operative Day 1  Subjective: up ad lib, voiding, tolerating PO, + flatus, and residual pain, controlled with oxycodone  Objective: Blood pressure 106/66, pulse 72, temperature 98.1 F (36.7 C), temperature source Oral, resp. rate 18, last menstrual period 05/13/2020, SpO2 95 %, unknown if currently breastfeeding.  Physical Exam:  General: alert, cooperative, and appears stated age 35: appropriate Uterine Fundus: firm Incision: healing well, no dehiscence, no significant erythema DVT Evaluation: No evidence of DVT seen on physical exam. Negative Homan's sign. No cords or calf tenderness.  Recent Labs    02/12/21 1741 02/13/21 0539  HGB 11.6* 10.7*  HCT 33.5* 31.1*    Assessment/Plan: Breastfeeding and Lactation consult. Plan for discharge home tomorrow vs Sunday.  Patient receiving Lovenox for six weeks per MFM for her Factor V Leiden. Hemoglobin appropriate post-operatively.    LOS: 1 day   Pearla Dubonnet 02/13/2021, 7:37 AM   I personally saw and evaluated the patient, performing the key elements of the service. I developed and verified the management plan that is described in the resident's/student's note, and I agree with the content with my edits above. VSS, HRR&R, Resp unlabored, Legs neg.  Nigel Berthold, CNM 02/16/2021 6:07 PM

## 2021-02-14 MED ORDER — IBUPROFEN 600 MG PO TABS
600.0000 mg | ORAL_TABLET | Freq: Four times a day (QID) | ORAL | Status: DC
  Administered 2021-02-14: 600 mg via ORAL
  Filled 2021-02-14 (×2): qty 1

## 2021-02-14 MED ORDER — ACETAMINOPHEN 325 MG PO TABS
650.0000 mg | ORAL_TABLET | Freq: Four times a day (QID) | ORAL | Status: DC
  Administered 2021-02-14 – 2021-02-15 (×5): 650 mg via ORAL
  Filled 2021-02-14 (×5): qty 2

## 2021-02-14 MED ORDER — ONDANSETRON 4 MG PO TBDP
4.0000 mg | ORAL_TABLET | Freq: Three times a day (TID) | ORAL | Status: DC | PRN
  Administered 2021-02-14: 4 mg via ORAL
  Filled 2021-02-14: qty 1

## 2021-02-14 NOTE — Progress Notes (Signed)
Pt called out for pain meds.Upon entering the room, pt informed the nurse that she was supposed to have the same nurse back from the previous night. Pt was informed that the previous nurse was not working. Pt was initially offered prn tylenol for pain. Pt appeared agitated, and stated that the previous nurse said she would have oxycodone scheduled every four hours. The nurse informed patient that her order for oxycodone was as needed, and administered '10mg'$  of oxycodone at this time. Pt declined other pain interventions at this time.

## 2021-02-14 NOTE — Lactation Note (Addendum)
This note was copied from a baby's chart. Lactation Consultation Note  Patient Name: Boy Cynnamon Kenter [id number] Date: 02/14/2021 Reason for consult: Follow-up assessment;Term (-8% weight loss) Age:33 hours Mom's current feeding choice is breast and formula. Per mom, she had past hx of oversupply with her daughter.  Per mom, infant BF 10 minutes then falls asleep mom not been breastfeeding infant skin to skin.  Going forward mom will breastfeed infant skin to skin and pre-pump breast with hand pump   prior to latching infant. Mom will use comfort gels after latching infant at the breast to help with breast soreness. Mom pre-pumped her left breast and latched infant using the football hold position, this is LC's first time observing a latch, infant latched with depth, swallows observed. [place] asked mom to break latch and Mom's nipples were round and not pinched, mom re-latched infant and infant breastfeed for 19 minutes.  Mom will continue to work on latching infant at the breast with depth. Mom's plan: 1- Continue to breastfeed infant according to feeding cues, 8 to 12+ times within 24 hours, skin to skin. 2-  Mom knows if she feels pain or discomfort with latch, to break latch and re-latch infant at breast or ask RN or LC  for latch assistance if needed. 3- Mom will continue to supplement infant with  any EBM first and then formula after latching at breast to help stabilize weight, Mom has infant feeding guidelines handout.   Maternal Data    Feeding Mother's Current Feeding Choice: Breast Milk and Formula  LATCH Score Latch: Grasps breast easily, tongue down, lips flanged, rhythmical sucking.  Audible Swallowing: Spontaneous and intermittent  Type of Nipple: Flat  Comfort (Breast/Nipple): Filling, red/small blisters or bruises, mild/mod discomfort  Hold (Positioning): Assistance needed to correctly position infant at breast and maintain latch.  LATCH Score: 7   Lactation Tools  Discussed/Used Reason for Pumping: pre-pump prior to latching infant due to having flat nipples.  Interventions Interventions: Skin to skin;Support pillows;Adjust position;Position options;Pre-pump if needed  Discharge    Consult Status Consult Status: Follow-up Date: 02/15/21 Follow-up type: In-patient    Vicente Serene 02/14/2021, 8:55 PM

## 2021-02-14 NOTE — Progress Notes (Addendum)
Went into patient's room to introduce myself with previous RN Gerome Sam. I asked the patient if she needed anything, she stated no. I assessed her infant and did his vital signs. I reviewed patient's medicines for the shift, and the patient raised her voice at me, saying she was no longer taking Lovenox. I explained the benefits and risks and why Lovenox was ordered by her MD. Patient stated that there was a communication problem at this hospital. I then said, mam I just came on shift 30 minutes ago, and her MAR showed Lovenox as an ordered medicine and she could refuse the Lovenox. She stated that she refused.

## 2021-02-14 NOTE — Progress Notes (Signed)
POSTPARTUM PROGRESS NOTE  POD #2  Subjective:  Cynthia Davidson is a 35 y.o. [id number] s/p rLTCS and BTL at [redacted]w[redacted]d Today she notes she is having issues with pain management.  Notes worsening pain with movement.  Just took ibuprofen this am. Tolerating po intake. Denies nausea or vomiting. Pt able to void, some ambulation.  She has some flatus, no BM.  Lochia moderate Denies fever/chills/chest pain/SOB.  no HA, no blurry vision, noRUQ pain  Objective: Blood pressure 116/78, pulse 98, temperature 98 F (36.7 C), temperature source Oral, resp. rate 18, last menstrual period 05/13/2020, SpO2 100 %, unknown if currently breastfeeding.  Physical Exam:  General: alert, cooperative and no distress Chest: no respiratory distress Heart: regular rate and rhythm Abdomen: soft, nontender, +BS Uterine Fundus: firm, appropriately tender Incision: C/D/I with bandage DVT Evaluation: No calf swelling or tenderness Extremities: no edema, SCDs in place Skin: warm, dry  CBC Latest Ref Rng & Units 02/13/2021 02/12/2021 02/10/2021  WBC 4.0 - 10.5 K/uL 12.7(H) 17.5(H) 8.1  Hemoglobin 12.0 - 15.0 g/dL 10.7(L) 11.6(L) 11.9(L)  Hematocrit 36.0 - 46.0 % 31.1(L) 33.5(L) 35.1(L)  Platelets 150 - 400 K/uL 142(L) 163 173    Assessment/Plan: Cynthia BUTTREYis a 35y.o. [id number]/[id number] repeat C-section & BTL at 334w2dOD#2  -pain medication transitioned to scheduled -encourage ambulation as tolerated -plan to continue Lovenox x 6wks -Undecided about circ, but will let usKoreanow later today  Contraception: tubal ligation Feeding: bottle  Dispo: Continue routine postop care, plan to discharge home on POD#3   LOS: 2 days   JeJanyth PupaDO Faculty Attending, [facility]/20/2022, 6:54 AM

## 2021-02-14 NOTE — Progress Notes (Signed)
Received from RN that patient was concerned about her dressing and nausea. Evaluated patient at bedside.   Nausea has been intermittent today, no vomiting or abdominal pain. Taking opioids on regular basis for pain control. Has not taken anything yet. Patient took off pressure dressing earlier and noted the honeycomb was saturated through the bottom and falling off. RN placed an additional honey comb on top, however patient is concerned about having two honey combs with the blood still remaining on the underlying dressing.   Provided reassurance for both. Dressing is dry and intact. Zofran ODT ordered. Offered to patient to remove both honeycomb dressings and replace just one with sterile technique, however she reports she will continue to monitor.   Patriciaann Clan, DO

## 2021-02-14 NOTE — Progress Notes (Signed)
Took patient her requested Oxycodone. I took 10 mg. Mom stated that she only wanted Oxy 5 mg. I offered her PRN Motrin and Tylenol. Patient refused both.

## 2021-02-14 NOTE — Progress Notes (Signed)
Went into Patient's room to do her morning vitals. Mom seemed upset and tearful. I asked her if she needed anything and asked her if she was ok. She stated that she was not satisfied with her care and that this was her worse experience she's ever had and she wanted to speak with HR today. I told the patient that I would notify the charge nurse and will contact management.

## 2021-02-15 MED ORDER — ENOXAPARIN SODIUM 40 MG/0.4ML IJ SOSY: 40.0000 mg | PREFILLED_SYRINGE | INTRAMUSCULAR | 0 refills | Status: DC

## 2021-02-15 MED ORDER — ACETAMINOPHEN 325 MG PO TABS: 650.0000 mg | ORAL_TABLET | Freq: Four times a day (QID) | ORAL | Status: DC

## 2021-02-15 MED ORDER — OXYCODONE HCL 5 MG PO TABS: 5.0000 mg | ORAL_TABLET | Freq: Four times a day (QID) | ORAL | 0 refills | Status: DC | PRN

## 2021-02-15 MED ORDER — ONDANSETRON 4 MG PO TBDP: 4.0000 mg | ORAL_TABLET | Freq: Three times a day (TID) | ORAL | 0 refills | Status: DC | PRN

## 2021-02-15 MED ORDER — IBUPROFEN 600 MG PO TABS
600.0000 mg | ORAL_TABLET | Freq: Four times a day (QID) | ORAL | 0 refills | Status: DC | PRN
Start: 2021-02-15 — End: 2023-02-24

## 2021-02-15 NOTE — Plan of Care (Signed)
  Problem: Activity: Goal: Will verbalize the importance of balancing activity with adequate rest periods Outcome: Adequate for Discharge Goal: Ability to tolerate increased activity will improve Outcome: Adequate for Discharge   Problem: Life Cycle: Goal: Chance of risk for complications during the postpartum period will decrease Outcome: Adequate for Discharge

## 2021-02-15 NOTE — Lactation Note (Signed)
This note was copied from a baby's chart. Lactation Consultation Note  Patient Name: Cynthia Davidson [id number] Date: 02/15/2021 Reason for consult: Follow-up assessment;Mother's request;Difficult latch;Term;Infant weight loss Age:33 hours Infant gained 25 grams from yesterday, LC informed mother.   LC went over breastfeeding supplementation volume after latching at breast. Mom will need to increase volume based on hrs of age of infant since delivery. BF supplementation guide provided.  LC not able to see a latch at the time of the visit. Mom stated nipple pain, LC asked to examine nipples, she declined. She asked for more comfort gels for nipple care. RN to provided coconut oil for nipple care as well.   Plan 1. To feed based on cues 8-12x 24 hr period. Mom to offer breasts and get depth with latch with cheeks and nose touching.  2. Mom to supplement with EBM first followed by formula with extra slow flow nipple following bf supplementation guide provided.  3. Mom gets pump from [facility], post pump DEBP q 3 hrs for 15 min   All questions answered at the end of the visit.    Maternal Data    Feeding Mother's Current Feeding Choice: Breast Milk and Formula  LATCH Score                    Lactation Tools Discussed/Used Tools: Pump;Flanges Breast pump type: Manual Reason for Pumping: elongate her nipples Pumping frequency: pre pump 5-10 min before latching  Interventions Interventions: Breast feeding basics reviewed;Hand pump;Hand express;Expressed milk;Education;Coconut oil;Comfort gels  Discharge Discharge Education: Engorgement and breast care;Warning signs for feeding baby;Outpatient recommendation Pump: Manual;Declined (Mom declined [facility] loaner process, states prefer to get a pump from [facility] direclty.)  Consult Status Consult Status: Complete Date: 02/15/21    Kawon Willcutt  Nicholson-Springer 02/15/2021, 1:09 PM

## 2021-02-15 NOTE — Discharge Instructions (Signed)
Congratulations!   -Take tylenol 1000 mg every 6 hours as needed for pain, alternate with ibuprofen 600 mg every 6 hours as needed  -Take oxycodone as needed if tylenol and ibuprofen aren't working -Drink plenty of water to help with breastfeeding -Continue prenatal vitamins while you are breastfeeding - Make sure if you haven't scheduled your postpartum visit, to schedule to be seen around 4-6 weeks or sooner if needed. You should also have your incision appointment this week or next.  - We decided that lovenox for the next 6 weeks would be the best course of action. If you are having significant bleeding with this, please let your provider know/be seen.

## 2021-02-15 NOTE — Plan of Care (Signed)
  Problem: Life Cycle: Goal: Chance of risk for complications during the postpartum period will decrease Outcome: Adequate for Discharge

## 2021-02-15 NOTE — Progress Notes (Signed)
Patient does not wish to take Lovenox until she speaks with the doctor.

## 2021-02-16 ENCOUNTER — Telehealth: Payer: Self-pay

## 2021-02-16 ENCOUNTER — Other Ambulatory Visit: Payer: Self-pay | Admitting: Obstetrics and Gynecology

## 2021-02-16 ENCOUNTER — Inpatient Hospital Stay: Admit: 2021-02-16 | Payer: Medicaid Other | Admitting: Obstetrics and Gynecology

## 2021-02-16 ENCOUNTER — Telehealth: Payer: Self-pay | Admitting: *Deleted

## 2021-02-16 SURGERY — Surgical Case
Anesthesia: Spinal

## 2021-02-16 MED ORDER — OXYCODONE-ACETAMINOPHEN 5-325 MG PO TABS: 1.0000 | ORAL_TABLET | Freq: Four times a day (QID) | ORAL | 0 refills | Status: DC | PRN

## 2021-02-16 NOTE — Telephone Encounter (Signed)
Pt had called requesting more pain medication and had questions if she needed to take the Lovenox or aspirin. Pt informed that Dr Ilda Basset will send in more pain medication and that pt should continue the Lovenox for 6 weeks postpartum. Pt also had asked about outpatient circ for her son. Sent a Pharmacist, community message with the 2 office that do perform them

## 2021-02-16 NOTE — Telephone Encounter (Signed)
-----   Message from Caren Macadam, MD sent at 02/16/2021 10:44 AM EDT ----- Regarding: RE: Questions about the postpartum 1.  She should have received 30pills of Percocet. I can send in but I am away from my computer (answering via phone). Can the provider at [facility] can send in?  2. The circ should have been done but since it was not- she could have mentioned this at the hospital before discharge. she can have it done at one of the offices ([facility] does them) or [place] did have a outpatient circ clinic. Not sure if this is still happening?    3) please ask her to take aspirin for 6 weeks ----- Message ----- From: Kendrick Ranch D Sent: 02/16/2021  10:32 AM EDT To: Caren Macadam, MD Subject: Questions about the postpartum                 This pt called and stated that she is having a lot of pain from her c section, she only received 8 pills for pain and cant recall what they were, shes having cramping, pain, and soreness that she didn't experience the first time. In addition shes interested in a circ for her son she didn't receive it in the hospital. Finally she was on Lovenox and it was D/C'd and she was told to take aspirin however someone at the hospital said it wasn't D/C's so she is concerned about this as well  Thanks  ~Kay

## 2021-02-16 NOTE — Telephone Encounter (Signed)
Transition Care Management Follow-up Telephone Call Date of discharge and from where: 02/15/2021-[facility]  How have you been since you were released from the hospital? Patient stated she is doing ok.  Any questions or concerns? No  Items Reviewed: Did the pt receive and understand the discharge instructions provided? Yes  Medications obtained and verified? Yes  Other? No  Any new allergies since your discharge? No  Dietary orders reviewed? N/A Do you have support at home? Yes   Home Care and Equipment/Supplies: Were home health services ordered? not applicable If so, what is the name of the agency? N/A  Has the agency set up a time to come to the patient's home? not applicable Were any new equipment or medical supplies ordered?  No What is the name of the medical supply agency? N/A Were you able to get the supplies/equipment? not applicable Do you have any questions related to the use of the equipment or supplies? No  Functional Questionnaire: (I = Independent and D = Dependent) ADLs: I  Bathing/Dressing- I  Meal Prep- I  Eating- I  Maintaining continence- I  Transferring/Ambulation- I  Managing Meds- I  Follow up appointments reviewed:  PCP Hospital f/u appt confirmed? No   Specialist Hospital f/u appt confirmed? Yes  Scheduled to see Dr. Ouida Sills on 03/03/2021 @ 3:15 pm. Are transportation arrangements needed? No  If their condition worsens, is the pt aware to call PCP or go to the Emergency Dept.? Yes Was the patient provided with contact information for the PCP's office or ED? Yes Was to pt encouraged to call back with questions or concerns? Yes

## 2021-02-19 ENCOUNTER — Ambulatory Visit: Payer: Medicaid Other

## 2021-02-20 ENCOUNTER — Telehealth: Payer: Self-pay | Admitting: Radiology

## 2021-02-20 ENCOUNTER — Other Ambulatory Visit: Payer: Self-pay | Admitting: *Deleted

## 2021-02-20 DIAGNOSIS — F53 Postpartum depression: Secondary | ICD-10-CM

## 2021-02-20 NOTE — Telephone Encounter (Signed)
Called and spoke with patient about conversation through [facility] about circumcision. We also discussed that she missed her incision check appointment yesterday 02/19/21. She stated that she did not have a ride to come to the appointment and that she would let the doctor check it at her postpartum appointment. I told her that of she stated to have any issues with the incision site to please contact the office so that we may evaluate it.  She states that she was having some postpartum "baby blues". I told her that I would inform the nurse and that have her referred to [facility]. Nurse informed and order placed. Appointment scheduled with Natalia Leatherwood.

## 2021-02-23 ENCOUNTER — Telehealth (HOSPITAL_COMMUNITY): Payer: Self-pay | Admitting: *Deleted

## 2021-02-23 NOTE — Telephone Encounter (Signed)
Left message to return nurse call.  Odis Hollingshead, RN 02/23/2021 at 3:54pm

## 2021-02-26 ENCOUNTER — Encounter: Payer: Medicaid Other | Admitting: Licensed Clinical Social Worker

## 2021-03-01 ENCOUNTER — Encounter (HOSPITAL_COMMUNITY): Payer: Self-pay | Admitting: Obstetrics and Gynecology

## 2021-03-01 ENCOUNTER — Inpatient Hospital Stay (HOSPITAL_COMMUNITY)
Admission: AD | Admit: 2021-03-01 | Discharge: 2021-03-01 | Disposition: A | Discharge status: Home / Self Care | Payer: Medicaid Other | Attending: Obstetrics and Gynecology | Admitting: Obstetrics and Gynecology

## 2021-03-01 ENCOUNTER — Other Ambulatory Visit: Payer: Self-pay

## 2021-03-01 DIAGNOSIS — Z885 Allergy status to narcotic agent status: Secondary | ICD-10-CM | POA: Insufficient documentation

## 2021-03-01 DIAGNOSIS — N61 Mastitis without abscess: Secondary | ICD-10-CM | POA: Diagnosis not present

## 2021-03-01 DIAGNOSIS — Z8616 Personal history of COVID-19: Secondary | ICD-10-CM | POA: Diagnosis not present

## 2021-03-01 DIAGNOSIS — Z881 Allergy status to other antibiotic agents status: Secondary | ICD-10-CM | POA: Insufficient documentation

## 2021-03-01 DIAGNOSIS — Z28310 Unvaccinated for covid-19: Secondary | ICD-10-CM | POA: Diagnosis not present

## 2021-03-01 DIAGNOSIS — R52 Pain, unspecified: Secondary | ICD-10-CM | POA: Insufficient documentation

## 2021-03-01 DIAGNOSIS — Z3A17 17 weeks gestation of pregnancy: Secondary | ICD-10-CM | POA: Diagnosis not present

## 2021-03-01 DIAGNOSIS — Z79899 Other long term (current) drug therapy: Secondary | ICD-10-CM | POA: Insufficient documentation

## 2021-03-01 LAB — URINALYSIS, ROUTINE W REFLEX MICROSCOPIC
Bacteria, UA: NONE SEEN
Bilirubin Urine: NEGATIVE
Glucose, UA: NEGATIVE mg/dL
Hgb urine dipstick: NEGATIVE
Ketones, ur: NEGATIVE mg/dL
Leukocytes,Ua: NEGATIVE
Nitrite: NEGATIVE
Protein, ur: 100 mg/dL — AB
Specific Gravity, Urine: >1.046 — ABNORMAL HIGH (ref 1.005–1.030)
pH: 5 (ref 5.0–8.0)

## 2021-03-01 LAB — BASIC METABOLIC PANEL
Anion gap: 8 (ref 5–15)
BUN: 12 mg/dL (ref 6–20)
CO2: 26 mmol/L (ref 22–32)
Calcium: 9 mg/dL (ref 8.9–10.3)
Chloride: 105 mmol/L (ref 98–111)
Creatinine, Ser: 0.64 mg/dL (ref 0.44–1.00)
GFR, Estimated: >60 mL/min (ref >60)
Glucose, Bld: 100 mg/dL — ABNORMAL HIGH (ref 70–99)
Potassium: 3.5 mmol/L (ref 3.5–5.1)
Sodium: 139 mmol/L (ref 135–145)

## 2021-03-01 LAB — CBC WITH DIFFERENTIAL/PLATELET
Abs Immature Granulocytes: 0.03 10*3/uL (ref 0.00–0.07)
Basophils Absolute: 0 10*3/uL (ref 0.0–0.1)
Basophils Relative: 0 %
Eosinophils Absolute: 0.2 10*3/uL (ref 0.0–0.5)
Eosinophils Relative: 3 %
HCT: 35.7 % — ABNORMAL LOW (ref 36.0–46.0)
Hemoglobin: 11.7 g/dL — ABNORMAL LOW (ref 12.0–15.0)
Immature Granulocytes: 0 %
Lymphocytes Relative: 14 %
Lymphs Abs: 1.1 10*3/uL (ref 0.7–4.0)
MCH: 31 pg (ref 26.0–34.0)
MCHC: 32.8 g/dL (ref 30.0–36.0)
MCV: 94.7 fL (ref 80.0–100.0)
Monocytes Absolute: 0.6 10*3/uL (ref 0.1–1.0)
Monocytes Relative: 8 %
Neutro Abs: 5.9 10*3/uL (ref 1.7–7.7)
Neutrophils Relative %: 75 %
Platelets: 236 10*3/uL (ref 150–400)
RBC: 3.77 MIL/uL — ABNORMAL LOW (ref 3.87–5.11)
RDW: 12.1 % (ref 11.5–15.5)
WBC: 7.8 10*3/uL (ref 4.0–10.5)
nRBC: 0 % (ref 0.0–0.2)

## 2021-03-01 MED ORDER — LACTATED RINGERS IV BOLUS
1000.0000 mL | Freq: Once | INTRAVENOUS | Status: AC
  Administered 2021-03-01: 1000 mL via INTRAVENOUS

## 2021-03-01 MED ORDER — ACETAMINOPHEN 500 MG PO TABS
1000.0000 mg | ORAL_TABLET | Freq: Once | ORAL | Status: AC
  Administered 2021-03-01: 1000 mg via ORAL
  Filled 2021-03-01: qty 2

## 2021-03-01 MED ORDER — CEPHALEXIN 500 MG PO CAPS
500.0000 mg | ORAL_CAPSULE | Freq: Once | ORAL | Status: AC
  Administered 2021-03-01: 500 mg via ORAL
  Filled 2021-03-01: qty 1

## 2021-03-01 MED ORDER — CEPHALEXIN 500 MG PO CAPS: 500.0000 mg | ORAL_CAPSULE | Freq: Four times a day (QID) | ORAL | 0 refills | Status: DC

## 2021-03-01 NOTE — MAU Provider Note (Signed)
History     CSN: [id number]  Arrival date and time: 03/01/21 1348   Event Date/Time   First Provider Initiated Contact with Patient 03/01/21 1515      Chief Complaint  Patient presents with   Breast Pain   Generalized Body Aches   Cynthia Davidson is a 35 y.o. [id number] at 17 weeks postpartum who receives care at [facility]  She presents today for Breast Pain and Generalized Body Aches.  She states her symptoms started 3 days ago.  She states her breast have been hurting since Friday.  She states she is not latching baby to breast, but is pumping.  However, she states she has been trying to "dry up" because the baby is losing weight and she has been unable to keep up with the pumping.  She reports she has been using cabbage leaves to help with the weaning process. She states she is also having left nipple pain and noted pus coming from the nipple.  Patient reports she is unsure if she has had a fever as she felt her temporal thermometer was inaccurate with a reading of 102.  She states that she "keeps waking up drenched in sweat."  She reports she has been taking ibuprofen '600mg'$  and last dose was round 10am.  She denies issues with urination, constipation, or diarrhea.  She reports she was diagnosed with mastitis after her first pregnancy and "almost required surgery because it was so bad." Patient expresses concern that this will result in a similar outcome. Patient reports having 2 negative Covid tests at home and has not had a Covid Vaccine.  She endorses a history of Covid.     OB History     Gravida  3   Para  2   Term  2   Preterm  0   AB  1   Living  2      SAB  0   IAB  1   Ectopic  0   Multiple  0   Live Births  2           Past Medical History:  Diagnosis Date   ADD (attention deficit disorder) 09/11/2016   Anxiety attack    Factor 5 Leiden mutation, heterozygous ([place]) 07/2016   hematology [facility]   Guttate psoriasis    Mastitis,  associated with childbirth 02/2012   required hospitalization and IV antibiotics   Panic attack     Past Surgical History:  Procedure Laterality Date   BIOPSY BREAST Bilateral 2008   CESAREAN SECTION  02/26/2012   active phase arrest (FTP)   CESAREAN SECTION WITH BILATERAL TUBAL LIGATION Bilateral 02/12/2021   Procedure: CESAREAN SECTION WITH BILATERAL TUBAL LIGATION;  Surgeon: Caren Macadam, MD;  Location: [facility] LD ORS;  Service: Obstetrics;  Laterality: Bilateral;   MANDIBLE SURGERY  2014   for TMJ   SINUS IRRIGATION     TONSILLECTOMY     WISDOM TOOTH EXTRACTION      Family History  Problem Relation Age of Onset   Stroke Maternal Uncle    Factor V Leiden deficiency Maternal Uncle    Stroke Paternal Aunt    Factor V Leiden deficiency Paternal Aunt    Stroke Maternal Grandfather    Melanoma Paternal Grandmother    Cerebral aneurysm Paternal Grandmother    Heart disease Paternal Grandfather    Heart attack Paternal Grandfather    Aortic aneurysm Paternal Grandfather     Social History   Tobacco  Use   Smoking status: Never   Smokeless tobacco: Never  Vaping Use   Vaping Use: Never used  Substance Use Topics   Alcohol use: Yes    Comment: occasional   Drug use: No    Allergies:  Allergies  Allergen Reactions   Doxycycline Swelling and Anaphylaxis   Morphine Nausea Only    Medications Prior to Admission  Medication Sig Dispense Refill Last Dose   Cholecalciferol (VITAMIN D-3) 125 MCG (5000 UT) TABS Take 5,000 mg by mouth daily as needed (random).   02/28/2021   enoxaparin (LOVENOX) 40 MG/0.4ML injection Inject 0.4 mLs (40 mg total) into the skin daily. 18 mL 0 02/28/2021   ibuprofen (ADVIL) 600 MG tablet Take 1 tablet (600 mg total) by mouth every 6 (six) hours as needed. 30 tablet 0 03/01/2021   oxyCODONE-acetaminophen (PERCOCET/ROXICET) 5-325 MG tablet Take 1-2 tablets by mouth every 6 (six) hours as needed. 27 tablet 0 Past Month   Prenatal Vit-Fe Fumarate-FA  (PRENATAL MULTIVITAMIN) TABS tablet Take 1 tablet by mouth daily at 12 noon.   02/28/2021   acetaminophen (TYLENOL) 325 MG tablet Take 2 tablets (650 mg total) by mouth every 6 (six) hours.      ondansetron (ZOFRAN-ODT) 4 MG disintegrating tablet Take 1 tablet (4 mg total) by mouth every 8 (eight) hours as needed for nausea or vomiting. 10 tablet 0 Unknown   oxyCODONE (OXY IR/ROXICODONE) 5 MG immediate release tablet Take 1-2 tablets (5-10 mg total) by mouth every 6 (six) hours as needed for severe pain. 8 tablet 0     Review of Systems  Constitutional:  Positive for chills, fatigue and fever.  Genitourinary:  Positive for dysuria and vaginal bleeding ("It's okay."). Negative for difficulty urinating.  Musculoskeletal:  Positive for myalgias.  Physical Exam   Blood pressure 111/65, pulse (!) 112, temperature 99.1 F (37.3 C), temperature source Oral, resp. rate 17, height '5\' 4"'$  (1.626 m), weight 83.3 kg, SpO2 98 %, not currently breastfeeding.  Physical Exam Constitutional:      Appearance: Normal appearance.  HENT:     Head: Normocephalic and atraumatic.  Eyes:     Conjunctiva/sclera: Conjunctivae normal.  Cardiovascular:     Rate and Rhythm: Normal rate and regular rhythm.     Heart sounds: Normal heart sounds.  Pulmonary:     Effort: Pulmonary effort is normal. No respiratory distress.     Breath sounds: Normal breath sounds.  Chest:  Breasts:    Left: Nipple discharge, skin change (Red) and tenderness present.     Comments: Left Breast: Engorgement apparent -Nipples intact. Areola with some questionable pus near nipple. No redness.  Abdominal:     General: Bowel sounds are normal.  Musculoskeletal:        General: Normal range of motion.     Cervical back: Normal range of motion.  Skin:    General: Skin is warm and dry.  Neurological:     Mental Status: She is alert and oriented to person, place, and time.  Psychiatric:        Mood and Affect: Mood normal.         Behavior: Behavior normal.        Thought Content: Thought content normal.    MAU Course  Procedures Results for orders placed or performed during the hospital encounter of 03/01/21 (from the past 24 hour(s))  Urinalysis, Routine w reflex microscopic Urine, Clean Catch     Status: Abnormal   Collection Time: 03/01/21  2:31 PM  Result Value Ref Range   Color, Urine AMBER (A) YELLOW   APPearance HAZY (A) CLEAR   Specific Gravity, Urine >1.046 (H) 1.005 - 1.030   pH 5.0 5.0 - 8.0   Glucose, UA NEGATIVE NEGATIVE mg/dL   Hgb urine dipstick NEGATIVE NEGATIVE   Bilirubin Urine NEGATIVE NEGATIVE   Ketones, ur NEGATIVE NEGATIVE mg/dL   Protein, ur 100 (A) NEGATIVE mg/dL   Nitrite NEGATIVE NEGATIVE   Leukocytes,Ua NEGATIVE NEGATIVE   RBC / HPF 6-10 0 - 5 RBC/hpf   WBC, UA 0-5 0 - 5 WBC/hpf   Bacteria, UA NONE SEEN NONE SEEN   Squamous Epithelial / LPF 0-5 0 - 5   Mucus PRESENT   CBC with Differential/Platelet     Status: Abnormal   Collection Time: 03/01/21  3:26 PM  Result Value Ref Range   WBC 7.8 4.0 - 10.5 K/uL   RBC 3.77 (L) 3.87 - 5.11 MIL/uL   Hemoglobin 11.7 (L) 12.0 - 15.0 g/dL   HCT 35.7 (L) 36.0 - 46.0 %   MCV 94.7 80.0 - 100.0 fL   MCH 31.0 26.0 - 34.0 pg   MCHC 32.8 30.0 - 36.0 g/dL   RDW 12.1 11.5 - 15.5 %   Platelets 236 150 - 400 K/uL   nRBC 0.0 0.0 - 0.2 %   Neutrophils Relative % 75 %   Neutro Abs 5.9 1.7 - 7.7 K/uL   Lymphocytes Relative 14 %   Lymphs Abs 1.1 0.7 - 4.0 K/uL   Monocytes Relative 8 %   Monocytes Absolute 0.6 0.1 - 1.0 K/uL   Eosinophils Relative 3 %   Eosinophils Absolute 0.2 0.0 - 0.5 K/uL   Basophils Relative 0 %   Basophils Absolute 0.0 0.0 - 0.1 K/uL   Immature Granulocytes 0 %   Abs Immature Granulocytes 0.03 0.00 - 0.07 K/uL  Basic metabolic panel     Status: Abnormal   Collection Time: 03/01/21  3:26 PM  Result Value Ref Range   Sodium 139 135 - 145 mmol/L   Potassium 3.5 3.5 - 5.1 mmol/L   Chloride 105 98 - 111 mmol/L   CO2 26  22 - 32 mmol/L   Glucose, Bld 100 (H) 70 - 99 mg/dL   BUN 12 6 - 20 mg/dL   Creatinine, Ser 0.64 0.44 - 1.00 mg/dL   Calcium 9.0 8.9 - 10.3 mg/dL   GFR, Estimated >60 >60 mL/min   Anion gap 8 5 - 15    MDM Labs: UA, CBC/D, BMP Exam Start IV IV Fluids Antibiotic Assessment and Plan  35 year old Postpartum State Mastitis  -POC Reviewed -Exam performed and findings discussed.  -Informed that findings are c/w mastitis. -Reassured that treatment will not require inpatient stay or surgery currently. -Discussed possibility of IV antibiotics and informed that provider will research. -Will start IV and give fluids. -Will also collect CBC and BMP. -Will give tylenol for breast pain.  -UA pending.   Maryann Conners 03/01/2021, 3:15 PM   Reassessment (4:51 PM)  -Results as above. -Informed that no recommendation for IV therapy for mastitis.  But will give first dose of antibiotic now. -Will also give additional bag of fluid as SG>1.046. -Informed of CBC results.  -Discussed treatment with Keflex '500mg'$  QID.  Informed that symptoms should start to resolve in the next 2-3 days with proper dosing. -Instructed on need to follow up with primary office if symptoms have not improved by Thursday morning.  -Patient  agreeable to sending antibiotics to 24 hr [facility]. -Discussed management of breast after weaning including: *Encouraged usage of tight bras and ice packs for engorgement.  *Tylenol for anticipated breast pain. -Patient verbalizes understanding and without questions or concerns. -Encouraged to call primary office or return to MAU if symptoms worsen or with the onset of new symptoms. -Discharged to home in stable condition.  Maryann Conners MSN, CNM Advanced Practice Provider, Center for [employer]

## 2021-03-01 NOTE — MAU Note (Signed)
...  Cynthia Davidson is a 35 y.o. at 2 weeks 3 days post partum c/s here in MAU reporting: left breast pain, dry cough, body aches, and HA's that began this past Friday 02/27/2021. Took two rapid at home COVID tests today that were negative. She states she is experiencing oozing that is light yellow from her left nipple. She states she has been alternating between sweating and cold chills. She states she drinks water all day but will only pee maybe twice. Has a hx of mastitis with her first pregnancy. She is also endorsing left buttock pain that she has been experiencing since she was d/c home and states it makes it hard to sleep.   Taking Lovenox daily but has not had her dose today. Taking Ibuprofen around the clock but unable to clarify when her last dose was.  She states she went to Urgent Care and they turned her away.   BP: 111/65 P: 112 T: 99.1 oral R: 18  O2: 98  Pain score: 7/10 left breast 7/10 HA  Lab orders placed from triage: UA

## 2021-03-01 NOTE — Progress Notes (Signed)
Ice applied to left breast.

## 2021-03-06 ENCOUNTER — Telehealth: Payer: Self-pay | Admitting: *Deleted

## 2021-03-06 NOTE — Telephone Encounter (Signed)
I received a call from the Family connect RN regarding pts Edinburgh score which was 13. Pt does want to connect with Seth Bake and does have an appt on 9/14 and I will make sure pt is aware. Pt is also asking if she can return to work this coming week. Will reach out to Dr Ernestina Patches to make her aware and will let pt know about returning to work

## 2021-03-09 ENCOUNTER — Encounter: Payer: Self-pay | Admitting: *Deleted

## 2021-03-11 ENCOUNTER — Ambulatory Visit (INDEPENDENT_AMBULATORY_CARE_PROVIDER_SITE_OTHER): Payer: Medicaid Other | Admitting: Licensed Clinical Social Worker

## 2021-03-11 DIAGNOSIS — O99345 Other mental disorders complicating the puerperium: Secondary | ICD-10-CM | POA: Diagnosis not present

## 2021-03-11 DIAGNOSIS — F418 Other specified anxiety disorders: Secondary | ICD-10-CM | POA: Diagnosis not present

## 2021-03-11 DIAGNOSIS — Z3A00 Weeks of gestation of pregnancy not specified: Secondary | ICD-10-CM

## 2021-03-11 NOTE — BH Specialist Note (Signed)
Integrated Behavioral Health via Telemedicine Visit  03/11/2021 ANOVA QUIRKE [id number]  Number of [place] visits: 1 Session Start time: 1:00pm  Session End time: 1:37pm Total time: 37 mins via mychart video  Referring Provider: Vertell Novak MD Patient/Family location: Home  [facility] Provider location: [place] All persons participating in visit: Pt B Gawron and LCSW A. Linton Rump  Types of Service: [place] (BHI)  I connected with [place] and/or [place] via  Telephone or Geologist, engineering  (Video is Tree surgeon) and verified that I am speaking with the correct person using two identifiers. Discussed confidentiality: Yes   I discussed the limitations of telemedicine and the availability of in person appointments.  Discussed there is a possibility of technology failure and discussed alternative modes of communication if that failure occurs.  I discussed that engaging in this telemedicine visit, they consent to the provision of behavioral healthcare and the services will be billed under their insurance.  Patient and/or legal guardian expressed understanding and consented to Telemedicine visit: Yes   Presenting Concerns: Patient and/or family reports the following symptoms/concerns: lack of family and social support, depressed mood and anxiety Duration of problem: approx one month; Severity of problem: mild mild Patient and/or Family's Strengths/Protective Factors: Concrete supports in place (healthy food, safe environments, etc.)  Goals Addressed: Patient will:  Reduce symptoms of: anxiety and stress   Increase knowledge and/or ability of: coping skills and stress reduction   Demonstrate ability to: Increase adequate support systems for patient/family  Progress towards Goals: Ongoing  Interventions: Interventions utilized:  Supportive Counseling Standardized  Assessments completed: Flavia Shipper Postnatal Depression  Patient and/or Family Response: Ms. Haar depressed mood due to lack of family and social support.   Assessment: Patient currently experiencing postpartum anxiety.   Patient may benefit from integrated behavioral health.  Plan: Follow up with behavioral health clinician on : 3 weeks mychart  Behavioral recommendations: n/a Referral(s): [place] (In Clinic)  I discussed the assessment and treatment plan with the patient and/or parent/guardian. They were provided an opportunity to ask questions and all were answered. They agreed with the plan and demonstrated an understanding of the instructions.   They were advised to call back or seek an in-person evaluation if the symptoms worsen or if the condition fails to improve as anticipated.  Lynnea Ferrier, LCSW

## 2021-03-17 ENCOUNTER — Ambulatory Visit (INDEPENDENT_AMBULATORY_CARE_PROVIDER_SITE_OTHER): Payer: Medicaid Other | Admitting: Obstetrics and Gynecology

## 2021-03-17 ENCOUNTER — Other Ambulatory Visit: Payer: Self-pay

## 2021-03-17 NOTE — Patient Instructions (Signed)
You can stop your Lovenox on September 30th.

## 2021-03-17 NOTE — Progress Notes (Signed)
    [place] Partum Visit Note  Cynthia Davidson is a 35 y.o. [id number] s/p a 39wk planned rpt c/s on 8/18 with BTL via filshie clips. Pregnancy c/b FVL heterozygote/  Anesthesia: spinal. Postpartum course has been uncomplicated. Baby is doing well. Baby is feeding by bottle Dory Horn . Bleeding staining only. Bowel function is normal. Bladder function is normal. Patient is not sexually active. Contraception method is tubal ligation. Postpartum depression screening: 8.  Patient with more pain in the immediate post op period that in prior c/s and wonders if could be from c/s.   Pap and HPV negative 2019 The pregnancy intention screening data noted above was reviewed. Potential methods of contraception were discussed. The patient elected to proceed with No data recorded.   Edinburgh Postnatal Depression Scale - 03/17/21 1320       Edinburgh Postnatal Depression Scale:  In the Past 7 Days   I have been able to laugh and see the funny side of things. 1    I have looked forward with enjoyment to things. 1    I have blamed myself unnecessarily when things went wrong. 0    I have been anxious or worried for no good reason. 2    I have felt scared or panicky for no good reason. 2    Things have been getting on top of me. 2    I have been so unhappy that I have had difficulty sleeping. 0    I have felt sad or miserable. 0    I have been so unhappy that I have been crying. 0    The thought of harming myself has occurred to me. 0    Edinburgh Postnatal Depression Scale Total 8             Review of Systems Pertinent items noted in HPI and remainder of comprehensive ROS otherwise negative.  Objective:  BP 102/70   Pulse 76   Ht 5\' 4"  (1.626 m)   Wt 183 lb 6.4 oz (83.2 kg)   Breastfeeding No   BMI 31.48 kg/m    Gen: NAD Abdomen: soft, nttp, nd Assessment:   Normal postpartum exam.   Plan:   *PP: routine care d/w pt  *Heme: recommend lovenox until 6wks PP (03/27/21)  RTC  PRN  Aletha Halim, MD [facility] [place] [place]

## 2021-03-26 ENCOUNTER — Encounter: Payer: Self-pay | Admitting: *Deleted

## 2021-03-31 ENCOUNTER — Ambulatory Visit: Payer: Medicaid Other | Admitting: Obstetrics & Gynecology

## 2021-04-17 ENCOUNTER — Telehealth: Payer: Self-pay | Admitting: Internal Medicine

## 2021-04-17 NOTE — Telephone Encounter (Signed)
..  Patient declines further follow up and engagement by the Managed Medicaid Team. Appropriate care team members and provider have been notified via electronic communication. The Managed Medicaid Team is available to follow up with the patient after provider conversation with the patient regarding recommendation for engagement and subsequent re-referral to the Managed Medicaid Team.    Jennifer Alley Care Guide, High Risk Medicaid Managed Care Embedded Care Coordination [place]  [facility]   

## 2022-06-11 ENCOUNTER — Ambulatory Visit: Payer: Medicaid Other | Admitting: Family Medicine

## 2022-06-23 NOTE — Progress Notes (Unsigned)
PCP:  Jodi Marble, MD   No chief complaint on file.    HPI:      [name] is a 36 y.o. [id number] who LMP was No LMP recorded., presents today for her annual examination.  Her menses are regular every 28-30 days, lasting 2-5 days.  Dysmenorrhea mild. She does not have intermenstrual bleeding. Had baby since last appt with me  Sex activity: not currently active - oral progesterone-only contraceptive. Hx of Factor V Leiden mutation. Last Pap: October 17, 2017  Results were: no abnormalities /neg HPV DNA   There is no FH of breast cancer. There is no FH of ovarian cancer. The patient does not do self-breast exams. 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 Doesn't like them. Hx of 2 benign breast bx in the past. No breast masses.   Tobacco use: The patient denies current or previous tobacco use. Alcohol use: none No drug use.  Exercise: moderately active  She does get adequate calcium and Vitamin D in her diet.   Past Medical History:  Diagnosis Date   ADD (attention deficit disorder) 09/11/2016   Anxiety attack    Factor 5 Leiden mutation, heterozygous ([place]) 07/2016   hematology [place]   Guttate psoriasis    Mastitis, associated with childbirth 02/2012   required hospitalization and IV antibiotics   Panic attack     Past Surgical History:  Procedure Laterality Date   BIOPSY BREAST Bilateral 2008   CESAREAN SECTION  02/26/2012   active phase arrest (FTP)   CESAREAN SECTION WITH BILATERAL TUBAL LIGATION Bilateral 02/12/2021   Procedure: CESAREAN SECTION WITH BILATERAL TUBAL LIGATION;  Surgeon: Caren Macadam, MD;  Location: [place] LD ORS;  Service: Obstetrics;  Laterality: Bilateral;   MANDIBLE SURGERY  2014   for TMJ   SINUS IRRIGATION     TONSILLECTOMY     WISDOM TOOTH EXTRACTION      Family History  Problem Relation Age of Onset   Stroke Maternal Uncle    Factor V Leiden  deficiency Maternal Uncle    Stroke Paternal Aunt    Factor V Leiden deficiency Paternal Aunt    Stroke Maternal Grandfather    Melanoma Paternal Grandmother    Cerebral aneurysm Paternal Grandmother    Heart disease Paternal Grandfather    Heart attack Paternal Grandfather    Aortic aneurysm Paternal Grandfather     Social History   Socioeconomic History   Marital status: Single    Spouse name: Not on file   Number of children: 1   Years of education: Not on file   Highest education level: Not on file  Occupational History   Occupation: LPN    Comment: works at [employer] Use   Smoking status: Never   Smokeless tobacco: Never  Vaping Use   Vaping Use: Never used  Substance and Sexual Activity   Alcohol use: Yes    Comment: occasional   Drug use: No   Sexual activity: Not Currently  Other Topics Concern   Not on file  Social History Narrative   Not on file   Social Determinants of Health   Financial Resource Strain: Not on file  Food Insecurity: Not on file  Transportation Needs: Not on file  Physical Activity: Not on file  Stress: Not on file  Social Connections: Not on file  Intimate Partner Violence: Not on file  Current Outpatient Medications:    acetaminophen (TYLENOL) 325 MG tablet, Take 2 tablets (650 mg total) by mouth every 6 (six) hours. (Patient not taking: Reported on 03/17/2021), Disp: , Rfl:    cephALEXin (KEFLEX) 500 MG capsule, Take 1 capsule (500 mg total) by mouth 4 (four) times daily. (Patient not taking: Reported on 03/17/2021), Disp: 55 capsule, Rfl: 0   Cholecalciferol (VITAMIN D-3) 125 MCG (5000 UT) TABS, Take 5,000 mg by mouth daily as needed (random). (Patient not taking: Reported on 03/17/2021), Disp: , Rfl:    enoxaparin (LOVENOX) 40 MG/0.4ML injection, Inject 0.4 mLs (40 mg total) into the skin daily., Disp: 18 mL, Rfl: 0   ibuprofen (ADVIL) 600 MG tablet, Take 1 tablet (600 mg total) by mouth every 6 (six) hours as needed.  (Patient not taking: Reported on 03/17/2021), Disp: 30 tablet, Rfl: 0   ondansetron (ZOFRAN-ODT) 4 MG disintegrating tablet, Take 1 tablet (4 mg total) by mouth every 8 (eight) hours as needed for nausea or vomiting. (Patient not taking: Reported on 03/17/2021), Disp: 10 tablet, Rfl: 0   oxyCODONE (OXY IR/ROXICODONE) 5 MG immediate release tablet, Take 1-2 tablets (5-10 mg total) by mouth every 6 (six) hours as needed for severe pain. (Patient not taking: Reported on 03/17/2021), Disp: 8 tablet, Rfl: 0   oxyCODONE-acetaminophen (PERCOCET/ROXICET) 5-325 MG tablet, Take 1-2 tablets by mouth every 6 (six) hours as needed. (Patient not taking: Reported on 03/17/2021), Disp: 27 tablet, Rfl: 0   Prenatal Vit-Fe Fumarate-FA (PRENATAL MULTIVITAMIN) TABS tablet, Take 1 tablet by mouth daily at 12 noon., Disp: , Rfl:      ROS:  Review of Systems  Constitutional:  Negative for fatigue, fever and unexpected weight change.  Respiratory:  Negative for cough, shortness of breath and wheezing.   Cardiovascular:  Negative for chest pain, palpitations and leg swelling.  Gastrointestinal:  Negative for blood in stool, constipation, diarrhea, nausea and vomiting.  Endocrine: Negative for cold intolerance, heat intolerance and polyuria.  Genitourinary:  Negative for dyspareunia, dysuria, flank pain, frequency, genital sores, hematuria, menstrual problem, pelvic pain, urgency, vaginal bleeding, vaginal discharge and vaginal pain.  Musculoskeletal:  Negative for back pain, joint swelling and myalgias.  Skin:  Negative for rash.  Neurological:  Negative for dizziness, syncope, light-headedness, numbness and headaches.  Hematological:  Negative for adenopathy.  Psychiatric/Behavioral:  Negative for agitation, confusion, sleep disturbance and suicidal ideas. The patient is not nervous/anxious.    BREAST: No symptoms   Objective: There were no vitals taken for this visit.   Physical Exam Constitutional:       Appearance: She is well-developed.  Genitourinary:     Vulva normal.     No vaginal discharge, erythema or tenderness.      Right Adnexa: not tender and no mass present.    Left Adnexa: not tender and no mass present.    No cervical polyp.     Uterus is not enlarged or tender.  Breasts:    Right: No mass, nipple discharge, skin change or tenderness.     Left: No mass, nipple discharge, skin change or tenderness.  Neck:     Thyroid: No thyromegaly.  Cardiovascular:     Rate and Rhythm: Normal rate and regular rhythm.     Heart sounds: Normal heart sounds. No murmur heard. Pulmonary:     Effort: Pulmonary effort is normal.     Breath sounds: Normal breath sounds.  Chest:     Comments: MONTGOMERY GLANDS BILAT WNL Abdominal:  Palpations: Abdomen is soft.     Tenderness: There is no abdominal tenderness. There is no guarding.  Musculoskeletal:        General: Normal range of motion.     Cervical back: Normal range of motion.  Neurological:     General: No focal deficit present.     Mental Status: She is alert and oriented to person, place, and time.     Cranial Nerves: No cranial nerve deficit.  Skin:    General: Skin is warm and dry.  Psychiatric:        Mood and Affect: Mood normal.        Behavior: Behavior normal.        Thought Content: Thought content normal.        Judgment: Judgment normal.  Vitals reviewed.     Assessment/Plan: Encounter for annual routine gynecological examination  Encounter for surveillance of contraceptive pills - Plan: norethindrone (MICRONOR) 0.35 MG tablet; POP RF.   Normal breast exam--reassurance re: montgomery glands. Can try warm compresses, minimize expression so as to not cause infection.  No orders of the defined types were placed in this encounter.            GYN counsel adequate intake of calcium and vitamin D, diet and exercise     F/U  No follow-ups on file.  Lorielle Boehning B. Caryssa Elzey, PA-C 06/23/2022 7:59 PM

## 2022-06-24 ENCOUNTER — Encounter: Payer: Self-pay | Admitting: Obstetrics and Gynecology

## 2022-06-24 ENCOUNTER — Ambulatory Visit (INDEPENDENT_AMBULATORY_CARE_PROVIDER_SITE_OTHER): Payer: Medicaid Other | Admitting: Obstetrics and Gynecology

## 2022-06-24 ENCOUNTER — Other Ambulatory Visit (HOSPITAL_COMMUNITY)
Admission: RE | Admit: 2022-06-24 | Discharge: 2022-06-24 | Disposition: A | Discharge status: Home / Self Care | Payer: Medicaid Other | Source: Ambulatory Visit | Attending: Obstetrics and Gynecology | Admitting: Obstetrics and Gynecology

## 2022-06-24 VITALS — BP 122/70 | Ht 64.0 in | Wt 166.0 lb

## 2022-06-24 DIAGNOSIS — Z01419 Encounter for gynecological examination (general) (routine) without abnormal findings: Secondary | ICD-10-CM

## 2022-06-24 DIAGNOSIS — Z1151 Encounter for screening for human papillomavirus (HPV): Secondary | ICD-10-CM | POA: Insufficient documentation

## 2022-06-24 DIAGNOSIS — Z124 Encounter for screening for malignant neoplasm of cervix: Secondary | ICD-10-CM | POA: Diagnosis present

## 2022-06-24 NOTE — Patient Instructions (Signed)
I value your feedback and you entrusting us with your care. If you get a [place] patient survey, I would appreciate you taking the time to let us know about your experience today. Thank you! ? ? ?

## 2022-06-25 LAB — CYTOLOGY - PAP
Adequacy: ABSENT
Comment: NEGATIVE
Diagnosis: NEGATIVE
High risk HPV: NEGATIVE

## 2022-10-27 NOTE — Telephone Encounter (Signed)
Error

## 2023-02-24 ENCOUNTER — Encounter: Payer: Self-pay | Admitting: Obstetrics and Gynecology

## 2023-02-24 ENCOUNTER — Other Ambulatory Visit (HOSPITAL_COMMUNITY)
Admission: RE | Admit: 2023-02-24 | Discharge: 2023-02-24 | Disposition: A | Discharge status: Home / Self Care | Payer: Medicaid Other | Source: Ambulatory Visit

## 2023-02-24 ENCOUNTER — Ambulatory Visit: Payer: Medicaid Other | Admitting: Obstetrics and Gynecology

## 2023-02-24 VITALS — BP 110/72 | Ht 64.5 in | Wt 168.0 lb

## 2023-02-24 DIAGNOSIS — N939 Abnormal uterine and vaginal bleeding, unspecified: Secondary | ICD-10-CM | POA: Diagnosis not present

## 2023-02-24 DIAGNOSIS — Z113 Encounter for screening for infections with a predominantly sexual mode of transmission: Secondary | ICD-10-CM | POA: Diagnosis not present

## 2023-02-24 DIAGNOSIS — R8290 Unspecified abnormal findings in urine: Secondary | ICD-10-CM | POA: Diagnosis not present

## 2023-02-24 LAB — POCT URINALYSIS DIPSTICK
Bilirubin, UA: NEGATIVE
Blood, UA: NEGATIVE
Glucose, UA: NEGATIVE
Ketones, UA: NEGATIVE
Leukocytes, UA: NEGATIVE
Nitrite, UA: NEGATIVE
Protein, UA: NEGATIVE
Spec Grav, UA: 1.02 (ref 1.010–1.025)
pH, UA: 7 (ref 5.0–8.0)

## 2023-02-24 NOTE — Patient Instructions (Signed)
I value your feedback and you entrusting us with your care. If you get a [facility] [place] patient survey, I would appreciate you taking the time to let us know about your experience today. Thank you! ? ? ?

## 2023-02-24 NOTE — Progress Notes (Signed)
Sherron Monday, MD   Chief Complaint  Patient presents with   irregular bleeding    X 3 weeks, no abnormal pain. In July pt had her normal cycle then spotted for a week after cycle    HPI:      [name] is a 37 y.o. [id number] whose LMP was Patient's last menstrual period was 02/07/2023 (exact date)., presents today for AUB.  Her menses are usually regular every 28-30 days, lasting 4-5 days, mod flow with clots, no BTB, mild dysmen, no meds needed. Had menses on time 7/24 with 2 wks spotting mid cycle. Bleeding stopped then had 02/07/23 menses on time, normal flow with subsequent spotting for a wk afterwards. Started with bleeding again a few days ago, real flow to spotting now.  No recent labs or u/s. Neg pap 12/23. No recent wt changes/sickness/stress.  Sex activity:  currently active, has new partner but was not before 7/24 AUB sx. S/p TL. No pain/bleeding/dryness. Hx of Factor V Leiden mutation. Pt drinks very little water daily and concerned about dark color of urine and not going to bathroom nearly all day.   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    Past Surgical History:  Procedure Laterality Date   BIOPSY BREAST Bilateral 2008   CESAREAN SECTION  02/26/2012   active phase arrest (FTP)   CESAREAN SECTION WITH BILATERAL TUBAL LIGATION Bilateral 02/12/2021   Procedure: CESAREAN SECTION WITH BILATERAL TUBAL LIGATION;  Surgeon: Federico Flake, MD;  Location: [facility] LD [facility];  Service: Obstetrics;  Laterality: Bilateral;   MANDIBLE SURGERY  2014   for TMJ   SINUS IRRIGATION     TONSILLECTOMY     WISDOM TOOTH EXTRACTION      Family History  Problem Relation Age of Onset   Stroke Maternal Uncle    Factor V Leiden deficiency Maternal Uncle    Stroke Paternal Aunt    Factor V Leiden deficiency Paternal Aunt    Stroke Maternal  Grandfather    Melanoma Paternal Grandmother    Cerebral aneurysm Paternal Grandmother    Heart disease Paternal Grandfather    Heart attack Paternal Grandfather    Aortic aneurysm Paternal Grandfather     Social History   Socioeconomic History   Marital status: Single    Spouse name: Not on file   Number of children: 1   Years of education: Not on file   Highest education level: Not on file  Occupational History   Occupation: LPN    Comment: works at [employer]  Tobacco Use   Smoking status: Never   Smokeless tobacco: Never  Vaping Use   Vaping status: Never Used  Substance and Sexual Activity   Alcohol use: Yes    Comment: occasional   Drug use: No   Sexual activity: Yes    Birth control/protection: Surgical    Comment: Tubal Ligation  Other Topics Concern   Not on file  Social History Narrative   Not on file   Social Determinants of Health   Financial Resource Strain: Not on file  Food Insecurity: Not on file  Transportation Needs: Not on file  Physical Activity: Not on file  Stress: Not on file  Social Connections: Not on file  Intimate Partner Violence: Not on file    Outpatient Medications Prior to Visit  Medication Sig Dispense Refill  amphetamine-dextroamphetamine (ADDERALL) 20 MG tablet Take 20 mg by mouth 2 (two) times daily.     Cholecalciferol (VITAMIN D-3) 125 MCG (5000 UT) TABS Take 5,000 mg by mouth daily as needed (random).     FLUoxetine (PROZAC) 10 MG capsule Take 10 mg by mouth daily.     acetaminophen (TYLENOL) 325 MG tablet Take 2 tablets (650 mg total) by mouth every 6 (six) hours. (Patient not taking: Reported on 03/17/2021)     amphetamine-dextroamphetamine (ADDERALL XR) 20 MG 24 hr capsule Take 20 mg by mouth daily.     cephALEXin (KEFLEX) 500 MG capsule Take 1 capsule (500 mg total) by mouth 4 (four) times daily. (Patient not taking: Reported on 03/17/2021) 55 capsule 0   enoxaparin (LOVENOX) 40 MG/0.4ML injection Inject 0.4 mLs (40 mg  total) into the skin daily. 18 mL 0   escitalopram (LEXAPRO) 10 MG tablet Take 10 mg by mouth daily.     ibuprofen (ADVIL) 600 MG tablet Take 1 tablet (600 mg total) by mouth every 6 (six) hours as needed. (Patient not taking: Reported on 03/17/2021) 30 tablet 0   ondansetron (ZOFRAN-ODT) 4 MG disintegrating tablet Take 1 tablet (4 mg total) by mouth every 8 (eight) hours as needed for nausea or vomiting. (Patient not taking: Reported on 03/17/2021) 10 tablet 0   oxyCODONE (OXY IR/ROXICODONE) 5 MG immediate release tablet Take 1-2 tablets (5-10 mg total) by mouth every 6 (six) hours as needed for severe pain. (Patient not taking: Reported on 03/17/2021) 8 tablet 0   oxyCODONE-acetaminophen (PERCOCET/ROXICET) 5-325 MG tablet Take 1-2 tablets by mouth every 6 (six) hours as needed. (Patient not taking: Reported on 03/17/2021) 27 tablet 0   Prenatal Vit-Fe Fumarate-FA (PRENATAL MULTIVITAMIN) TABS tablet Take 1 tablet by mouth daily at 12 noon. (Patient not taking: Reported on 06/24/2022)     No facility-administered medications prior to visit.      ROS:  Review of Systems  Constitutional:  Negative for fever.  Gastrointestinal:  Negative for blood in stool, constipation, diarrhea, nausea and vomiting.  Genitourinary:  Positive for vaginal bleeding. Negative for dyspareunia, dysuria, flank pain, frequency, hematuria, urgency, vaginal discharge and vaginal pain.  Musculoskeletal:  Negative for back pain.  Skin:  Negative for rash.   BREAST: No symptoms   OBJECTIVE:   Vitals:  BP 110/72   Ht 5' 4.5" (1.638 m)   Wt 168 lb (76.2 kg)   LMP 02/07/2023 (Exact Date)   BMI 28.39 kg/m   Physical Exam Vitals reviewed.  Constitutional:      Appearance: She is well-developed.  Pulmonary:     Effort: Pulmonary effort is normal.  Genitourinary:    General: Normal vulva.     Pubic Area: No rash.      Labia:        Right: No rash, tenderness or lesion.        Left: No rash, tenderness or lesion.       Vagina: Bleeding present. No vaginal discharge, erythema or tenderness.     Cervix: Normal.     Uterus: Normal. Not enlarged and not tender.      Adnexa: Right adnexa normal and left adnexa normal.       Right: No mass or tenderness.         Left: No mass or tenderness.       Comments: BROWN D/C ON VAG EXAM Musculoskeletal:        General: Normal range of motion.     Cervical  back: Normal range of motion.  Skin:    General: Skin is warm and dry.  Neurological:     General: No focal deficit present.     Mental Status: She is alert and oriented to person, place, and time.  Psychiatric:        Mood and Affect: Mood normal.        Behavior: Behavior normal.        Thought Content: Thought content normal.        Judgment: Judgment normal.     Results: Results for orders placed or performed in visit on 02/24/23 (from the past 24 hour(s))  POCT Urinalysis Dipstick     Status: Normal   Collection Time: 02/24/23  5:01 PM  Result Value Ref Range   Color, UA amber    Clarity, UA clear    Glucose, UA Negative Negative   Bilirubin, UA neg    Ketones, UA neg    Spec Grav, UA 1.020 1.010 - 1.025   Blood, UA neg    pH, UA 7.0 5.0 - 8.0   Protein, UA Negative Negative   Urobilinogen, UA     Nitrite, UA neg    Leukocytes, UA Negative Negative   Appearance     Odor       Assessment/Plan: Abnormal uterine bleeding (AUB) - Plan: TSH + free T4, Prolactin, Cervicovaginal ancillary only, US PELVIS TRANSVAGINAL NON-OB (TV ONLY); sx since 7/24. Check labs and GYN u/s. Will f/u with results and mgmt options. Pt to follow cycles in meantime.   Screening for STD (sexually transmitted disease) - Plan: Cervicovaginal ancillary only  Abnormal urine odor - Plan: POCT Urinalysis Dipstick; neg UA. Increase water for dehydration.     Return in about 2 days (around 02/26/2023) for GYN u/s for AUB--[facility] to call pt.  Vallie Fayette B. Judythe Postema, PA-C 02/24/2023 5:01 PM

## 2023-02-25 LAB — TSH+FREE T4
Free T4: 1.22 ng/dL (ref 0.82–1.77)
TSH: 0.926 u[IU]/mL (ref 0.450–4.500)

## 2023-02-25 LAB — PROLACTIN: Prolactin: 11.5 ng/mL (ref 4.8–33.4)

## 2023-03-01 NOTE — Addendum Note (Signed)
Addended by: Althea Grimmer B on: 03/01/2023 10:54 AM   Modules accepted: Orders

## 2023-03-03 LAB — CERVICOVAGINAL ANCILLARY ONLY
Chlamydia: NEGATIVE
Comment: NEGATIVE
Comment: NORMAL
Neisseria Gonorrhea: NEGATIVE

## 2023-03-07 ENCOUNTER — Ambulatory Visit
Admission: RE | Admit: 2023-03-07 | Discharge: 2023-03-07 | Disposition: A | Discharge status: Home / Self Care | Payer: Medicaid Other | Source: Ambulatory Visit | Attending: Obstetrics and Gynecology | Admitting: Obstetrics and Gynecology

## 2023-03-07 DIAGNOSIS — N939 Abnormal uterine and vaginal bleeding, unspecified: Secondary | ICD-10-CM | POA: Diagnosis present

## 2023-03-15 ENCOUNTER — Telehealth: Payer: Self-pay

## 2023-03-15 NOTE — Telephone Encounter (Signed)
Pt calling for u/s results.  [phone]  Pt aware results aren't back yet; [facility] u/s has been called to get results; nation wide Radiologist shortage.

## 2023-06-09 ENCOUNTER — Telehealth: Payer: Self-pay | Admitting: *Deleted

## 2023-06-09 NOTE — Telephone Encounter (Signed)
CALLED PATIENT TO ALTER FU ON 06-20-23 DUE TO DR. KINARD BEING ON VACATION, RESCHEDULED FOR 07-04-23  @ 4 PM, LVM FOR A RETURN CALL

## 2023-06-20 ENCOUNTER — Ambulatory Visit: Payer: Self-pay | Admitting: Radiation Oncology

## 2023-06-27 ENCOUNTER — Ambulatory Visit: Payer: Medicaid Other | Admitting: Obstetrics and Gynecology

## 2023-07-01 ENCOUNTER — Ambulatory Visit: Payer: Medicaid Other | Admitting: Internal Medicine

## 2023-07-01 VITALS — BP 118/83 | HR 90 | Ht 64.0 in | Wt 171.4 lb

## 2023-07-01 DIAGNOSIS — D6851 Activated protein C resistance: Secondary | ICD-10-CM

## 2023-07-01 DIAGNOSIS — F908 Attention-deficit hyperactivity disorder, other type: Secondary | ICD-10-CM | POA: Diagnosis not present

## 2023-07-01 DIAGNOSIS — E663 Overweight: Secondary | ICD-10-CM | POA: Diagnosis not present

## 2023-07-01 MED ORDER — WEGOVY 0.25 MG/0.5ML ~~LOC~~ SOAJ: 0.2500 mg | SUBCUTANEOUS | 0 refills | Status: AC

## 2023-07-01 NOTE — Progress Notes (Signed)
 Established Patient Office Visit  Subjective:  Patient ID: Cynthia Davidson, female    DOB: 04-17-1986  Age: 38 y.o. MRN: [id number]  Chief Complaint  Patient presents with  . Weight Loss    C/o being overweight.    No other concerns at this time.   Past Medical History:  Diagnosis Date  . ADD (attention deficit disorder) 09/11/2016  . Anxiety attack   . Factor 5 Leiden mutation, heterozygous (HCC) 07/2016   hematology [place]  . Guttate psoriasis   . Mastitis, associated with childbirth 02/2012   required hospitalization and IV antibiotics  . Panic attack     Past Surgical History:  Procedure Laterality Date  . BIOPSY BREAST Bilateral 2008  . CESAREAN SECTION  02/26/2012   active phase arrest (FTP)  . CESAREAN SECTION WITH BILATERAL TUBAL LIGATION Bilateral 02/12/2021   Procedure: CESAREAN SECTION WITH BILATERAL TUBAL LIGATION;  Surgeon: Eldonna Suzen Octave, MD;  Location: [facility] LD [facility];  Service: Obstetrics;  Laterality: Bilateral;  . 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 SINUS IRRIGATION    . TONSILLECTOMY    . WISDOM TOOTH EXTRACTION      Social History   Socioeconomic History  . Marital status: Single    Spouse name: Not on file  . Number of children: 1  . Years of education: Not on file  . Highest education level: Not on file  Occupational History  . Occupation: LPN    Comment: works at [employer]  Tobacco Use  . Smoking status: Never  . Smokeless tobacco: Never  Vaping Use  . Vaping status: Never Used  Substance and Sexual Activity  . Alcohol use: Yes    Comment: occasional  . Drug use: No  . Sexual activity: Yes    Birth control/protection: Surgical    Comment: Tubal Ligation  Other Topics Concern  . Not on file  Social History Narrative  . Not on file   Social Drivers of Health   Financial Resource Strain: Not on file  Food Insecurity: Not on file  Transportation Needs: Not on file  Physical Activity: Not on file  Stress: Not on  file  Social Connections: Not on file  Intimate Partner Violence: Not on file    Family History  Problem Relation Age of Onset  . Stroke Maternal Uncle   . Factor V Leiden deficiency Maternal Uncle   . Stroke Paternal Aunt   . Factor V Leiden deficiency Paternal Aunt   . Stroke Maternal Grandfather   . Melanoma Paternal Grandmother   . Cerebral aneurysm Paternal Grandmother   . Heart disease Paternal Grandfather   . Heart attack Paternal Grandfather   . Aortic aneurysm Paternal Grandfather     Allergies  Allergen Reactions  . Doxycycline Anaphylaxis and Swelling  . Morphine  Nausea Only    Outpatient Medications Prior to Visit  Medication Sig  . amphetamine-dextroamphetamine (ADDERALL) 20 MG tablet Take 20 mg by mouth 2 (two) times daily.  . Cholecalciferol (VITAMIN D -3) 125 MCG (5000 UT) TABS Take 5,000 mg by mouth daily as needed (random).  SABRA FLUoxetine (PROZAC) 10 MG capsule Take 10 mg by mouth daily.   No facility-administered medications prior to visit.    ROS     Objective:   BP 118/83   Pulse 90   Ht 5' 4 (1.626 m)   Wt 171 lb 6.4 oz (77.7 kg)   SpO2 99%   BMI 29.42 kg/m   Vitals:  07/01/23 1200  BP: 118/83  Pulse: 90  Height: 5' 4 (1.626 m)  Weight: 171 lb 6.4 oz (77.7 kg)  SpO2: 99%  BMI (Calculated): 29.41    Physical Exam Vitals reviewed.  Constitutional:      General: She is not in acute distress.    Appearance: She is overweight.  HENT:     Head: Normocephalic.     Nose: Nose normal.     Mouth/Throat:     Mouth: Mucous membranes are moist.  Eyes:     Extraocular Movements: Extraocular movements intact.     Pupils: Pupils are equal, round, and reactive to light.  Cardiovascular:     Rate and Rhythm: Normal rate and regular rhythm.     Heart sounds: No murmur heard. Pulmonary:     Effort: Pulmonary effort is normal.     Breath sounds: No rhonchi or rales.  Abdominal:     General: Abdomen is flat.     Palpations: There is no  hepatomegaly, splenomegaly or mass.  Musculoskeletal:        General: Normal range of motion.     Cervical back: Normal range of motion. No tenderness.  Skin:    General: Skin is warm and dry.  Neurological:     General: No focal deficit present.     Mental Status: She is alert and oriented to person, place, and time.     Cranial Nerves: No cranial nerve deficit.     Motor: No weakness.  Psychiatric:        Mood and Affect: Mood normal.        Behavior: Behavior normal.     No results found for any visits on 07/01/23.  No results found for this or any previous visit (from the past 2160 hours).    Assessment & Plan:  As per problem list  Problem List Items Addressed This Visit       Hematopoietic and Hemostatic   Factor 5 Leiden mutation, heterozygous [facility])     Other   Other specified attention deficit hyperactivity disorder (ADHD)   Overweight (BMI 25.0-29.9) - Primary    Return in about 3 months (around 09/29/2023).   Total time spent: 20 minutes  Sherrill Cinderella Perry, MD  07/01/2023   This document may have been prepared by [facility] Voice Recognition software and as such may include unintentional dictation errors.

## 2023-07-04 ENCOUNTER — Ambulatory Visit: Payer: Medicaid Other | Admitting: Radiation Oncology

## 2023-07-10 NOTE — Progress Notes (Signed)
 PCP:  Albina GORMAN Dine, MD   Chief Complaint  Patient presents with   Gynecologic Exam    No concerns     HPI:      [name] is a 38 y.o. [id number] who LMP was Patient's last menstrual period was 07/05/2023 (approximate)., presents today for her annual examination.  Her menses are regular every 28-30 days, lasting 4 days, heavy flow, with 1/4 to 1/2 dollar sized clots.  Flow heavier since TL. Dysmenorrhea mild. She does not have intermenstrual bleeding now. S/p AUB 7/24 with normal TSH/prolactin labs and normal GYN u/s 9/24.  Hx of IDA on 9/22 labs, not taking Fe supp.   Sex activity: currently sexually active--contraception TL. No pain/bleeding/dryness.  Hx of Factor V Leiden mutation. Last Pap: 06/24/22 Results were: no abnormalities /neg HPV DNA   There is no FH of breast cancer. There is no FH of ovarian cancer. The patient does self-breast exams. Hx of 2 benign breast bx in the past.   Tobacco use: The patient denies current or previous tobacco use. Alcohol use: none No drug use.  Exercise: min active  She does not get adequate calcium but does get Vitamin D  in her diet. Labs with PCP.   Past Medical History:  Diagnosis Date   ADD (attention deficit disorder) 09/11/2016   Anxiety attack    Factor 5 Leiden mutation, heterozygous (HCC) 07/2016   hematology    Guttate psoriasis    Mastitis, associated with childbirth 02/2012   required hospitalization and IV antibiotics   Panic attack     Past Surgical History:  Procedure Laterality Date   BIOPSY BREAST Bilateral 2008   CESAREAN SECTION  02/26/2012   active phase arrest (FTP)   CESAREAN SECTION WITH BILATERAL TUBAL LIGATION Bilateral 02/12/2021   Procedure: CESAREAN SECTION WITH BILATERAL TUBAL LIGATION;  Surgeon: Eldonna Suzen Octave, MD;  Location: [facility] LD ORS;  Service: Obstetrics;  Laterality: Bilateral;   MANDIBLE SURGERY  2014   for TMJ   SINUS IRRIGATION     TONSILLECTOMY      WISDOM TOOTH EXTRACTION      Family History  Problem Relation Age of Onset   Stroke Maternal Uncle    Factor V Leiden deficiency Maternal Uncle    Stroke Paternal Aunt    Factor V Leiden deficiency Paternal Aunt    Stroke Maternal Grandfather    Melanoma Paternal Grandmother    Cerebral aneurysm Paternal Grandmother    Heart disease Paternal Grandfather    Heart attack Paternal Grandfather    Aortic aneurysm Paternal Grandfather     Social History   Socioeconomic History   Marital status: Single    Spouse name: Not on file   Number of children: 1   Years of education: Not on file   Highest education level: Not on file  Occupational History   Occupation: LPN    Comment: works at [employer]  Tobacco Use   Smoking status: Never   Smokeless tobacco: Never  Vaping Use   Vaping status: Never Used  Substance and Sexual Activity   Alcohol use: Yes    Comment: occasional   Drug use: No   Sexual activity: Yes    Birth control/protection: Surgical    Comment: Tubal Ligation  Other Topics Concern   Not on file  Social History Narrative   Not on file   Social Drivers of Health   Financial Resource Strain: Not on file  Food Insecurity: Not on  file  Transportation Needs: Not on file  Physical Activity: Not on file  Stress: Not on file  Social Connections: Not on file  Intimate Partner Violence: Not on file     Current Outpatient Medications:    amphetamine-dextroamphetamine (ADDERALL) 20 MG tablet, Take 20 mg by mouth 2 (two) times daily., Disp: , Rfl:    Cholecalciferol (VITAMIN D -3) 125 MCG (5000 UT) TABS, Take 5,000 mg by mouth daily as needed (random)., Disp: , Rfl:    FLUoxetine (PROZAC) 10 MG capsule, Take 10 mg by mouth daily., Disp: , Rfl:    valACYclovir  (VALTREX ) 1000 MG tablet, Take 1,000 mg by mouth 2 (two) times daily., Disp: , Rfl:    Semaglutide -Weight Management (WEGOVY ) 0.25 MG/0.5ML SOAJ, Inject 0.25 mg into the skin once a week for 28 days. (Patient  not taking: Reported on 07/11/2023), Disp: 2 mL, Rfl: 0     ROS:  Review of Systems  Constitutional:  Negative for fatigue, fever and unexpected weight change.  Respiratory:  Negative for cough, shortness of breath and wheezing.   Cardiovascular:  Negative for chest pain, palpitations and leg swelling.  Gastrointestinal:  Negative for blood in stool, constipation, diarrhea, nausea and vomiting.  Endocrine: Negative for cold intolerance, heat intolerance and polyuria.  Genitourinary:  Negative for dyspareunia, dysuria, flank pain, frequency, genital sores, hematuria, menstrual problem, pelvic pain, urgency, vaginal bleeding, vaginal discharge and vaginal pain.  Musculoskeletal:  Negative for back pain, joint swelling and myalgias.  Skin:  Negative for rash.  Neurological:  Negative for dizziness, syncope, light-headedness, numbness and headaches.  Hematological:  Negative for adenopathy.  Psychiatric/Behavioral:  Negative for agitation, confusion, sleep disturbance and suicidal ideas. The patient is not nervous/anxious.    BREAST: No symptoms   Objective: BP (!) 140/71   Pulse 87   Ht 5' 4.5 (1.638 m)   Wt 177 lb (80.3 kg)   LMP 07/05/2023 (Approximate)   BMI 29.91 kg/m    Physical Exam Constitutional:      Appearance: She is well-developed.  Genitourinary:     Vulva normal.     Right Labia: No rash, tenderness or lesions.    Left Labia: No tenderness, lesions or rash.    No vaginal discharge, erythema or tenderness.      Right Adnexa: not tender and no mass present.    Left Adnexa: not tender and no mass present.    No cervical friability or polyp.     Uterus is not enlarged or tender.  Breasts:    Right: No mass, nipple discharge, skin change or tenderness.     Left: No mass, nipple discharge, skin change or tenderness.  Neck:     Thyroid : No thyromegaly.  Cardiovascular:     Rate and Rhythm: Normal rate and regular rhythm.     Heart sounds: Normal heart sounds.  No murmur heard. Pulmonary:     Effort: Pulmonary effort is normal.     Breath sounds: Normal breath sounds.  Chest:     Comments: MONTGOMERY GLANDS BILAT WNL Abdominal:     Palpations: Abdomen is soft.     Tenderness: There is no abdominal tenderness. There is no guarding or rebound.  Musculoskeletal:        General: Normal range of motion.     Cervical back: Normal range of motion.  Lymphadenopathy:     Cervical: No cervical adenopathy.  Neurological:     General: No focal deficit present.     Mental Status: She is alert  and oriented to person, place, and time.     Cranial Nerves: No cranial nerve deficit.  Skin:    General: Skin is warm and dry.  Psychiatric:        Mood and Affect: Mood normal.        Behavior: Behavior normal.        Thought Content: Thought content normal.        Judgment: Judgment normal.  Vitals reviewed.    Results for orders placed or performed in visit on 07/11/23 (from the past 24 hours)  POCT hemoglobin     Status: Normal   Collection Time: 07/11/23  3:47 PM  Result Value Ref Range   Hemoglobin 14.5 11 - 14.6 g/dL     Assessment/Plan: Encounter for annual routine gynecological examination  Factor 5 Leiden mutation, heterozygous (HCC)  Menorrhagia with regular cycle - Plan: POCT hemoglobin; normal HgB. Add MVI with Fe. F/u prn.             GYN counsel adequate intake of calcium and vitamin D , diet and exercise     F/U  Return in about 1 year (around 07/10/2024).  Leevi Cullars B. Jeston Junkins, PA-C 07/11/2023 3:47 PM

## 2023-07-11 ENCOUNTER — Encounter: Payer: Self-pay | Admitting: Obstetrics and Gynecology

## 2023-07-11 ENCOUNTER — Ambulatory Visit (INDEPENDENT_AMBULATORY_CARE_PROVIDER_SITE_OTHER): Payer: Medicaid Other | Admitting: Obstetrics and Gynecology

## 2023-07-11 VITALS — BP 140/71 | HR 87 | Ht 64.5 in | Wt 177.0 lb

## 2023-07-11 DIAGNOSIS — D6851 Activated protein C resistance: Secondary | ICD-10-CM

## 2023-07-11 DIAGNOSIS — Z01419 Encounter for gynecological examination (general) (routine) without abnormal findings: Secondary | ICD-10-CM | POA: Diagnosis not present

## 2023-07-11 DIAGNOSIS — N920 Excessive and frequent menstruation with regular cycle: Secondary | ICD-10-CM

## 2023-07-11 LAB — POCT HEMOGLOBIN: Hemoglobin: 14.5 g/dL (ref 11–14.6)

## 2023-07-11 NOTE — Patient Instructions (Signed)
 I value your feedback and you entrusting [place] with your care. If you get a [place] patient survey, I would appreciate you taking the time to let us know about your experience today. Thank you! ? ? ?

## 2023-08-08 ENCOUNTER — Ambulatory Visit (INDEPENDENT_AMBULATORY_CARE_PROVIDER_SITE_OTHER): Payer: Medicaid Other | Admitting: Internal Medicine

## 2023-08-08 VITALS — BP 120/70 | HR 106 | Temp 96.6°F | Ht 64.0 in | Wt 171.0 lb

## 2023-08-08 DIAGNOSIS — D6851 Activated protein C resistance: Secondary | ICD-10-CM

## 2023-08-08 DIAGNOSIS — Z0130 Encounter for examination of blood pressure without abnormal findings: Secondary | ICD-10-CM

## 2023-08-08 DIAGNOSIS — J0100 Acute maxillary sinusitis, unspecified: Secondary | ICD-10-CM | POA: Diagnosis not present

## 2023-08-08 DIAGNOSIS — E663 Overweight: Secondary | ICD-10-CM | POA: Diagnosis not present

## 2023-08-08 DIAGNOSIS — Z0001 Encounter for general adult medical examination with abnormal findings: Secondary | ICD-10-CM

## 2023-08-08 MED ORDER — AMOXICILLIN-POT CLAVULANATE 875-125 MG PO TABS: 1.0000 | ORAL_TABLET | Freq: Two times a day (BID) | ORAL | 0 refills | Status: AC

## 2023-08-08 NOTE — Progress Notes (Signed)
 Established Patient Office Visit  Subjective:  Patient ID: Cynthia Davidson, female    DOB: March 18, 1986  Age: 38 y.o. MRN: [id number]  Chief Complaint  Patient presents with   Annual Exam    CPE    No new complaints, here for CPE and medication refills.     No other concerns at this time.   Past Medical History:  Diagnosis Date   ADD (attention deficit disorder) 09/11/2016   Anxiety attack    Factor 5 Leiden mutation, heterozygous (HCC) 07/2016   hematology [place]   Guttate psoriasis    Mastitis, associated with childbirth 02/2012   required hospitalization and IV antibiotics   Panic attack     Past Surgical History:  Procedure Laterality Date   BIOPSY BREAST Bilateral 2008   CESAREAN SECTION  02/26/2012   active phase arrest (FTP)   CESAREAN SECTION WITH BILATERAL TUBAL LIGATION Bilateral 02/12/2021   Procedure: CESAREAN SECTION WITH BILATERAL TUBAL LIGATION;  Surgeon: Abner Ables, MD;  Location: [facility] LD ORS;  Service: Obstetrics;  Laterality: Bilateral;   MANDIBLE SURGERY  2014   for TMJ   SINUS IRRIGATION     TONSILLECTOMY     WISDOM TOOTH EXTRACTION      Social History   Socioeconomic History   Marital status: Single    Spouse name: Not on file   Number of children: 1   Years of education: Not on file   Highest education level: Not on file  Occupational History   Occupation: LPN    Comment: works at [employer]  Tobacco Use   Smoking status: Never   Smokeless tobacco: Never  Vaping Use   Vaping status: Never Used  Substance and Sexual Activity   Alcohol use: Yes    Comment: occasional   Drug use: No   Sexual activity: Yes    Birth control/protection: Surgical    Comment: Tubal Ligation  Other Topics Concern   Not on file  Social History Narrative   Not on file   Social Drivers of Health   Financial Resource Strain: Not on file  Food Insecurity: Not on file  Transportation Needs: Not on file  Physical Activity: Not on  file  Stress: Not on file  Social Connections: Not on file  Intimate Partner Violence: Not on file    Family History  Problem Relation Age of Onset   Stroke Maternal Uncle    Factor V Leiden deficiency Maternal Uncle    Stroke Paternal Aunt    Factor V Leiden deficiency Paternal Aunt    Stroke Maternal Grandfather    Melanoma Paternal Grandmother    Cerebral aneurysm Paternal Grandmother    Heart disease Paternal Grandfather    Heart attack Paternal Grandfather    Aortic aneurysm Paternal Grandfather     Allergies  Allergen Reactions   Doxycycline Anaphylaxis and Swelling   Morphine  Nausea Only    Outpatient Medications Prior to Visit  Medication Sig   amphetamine-dextroamphetamine (ADDERALL) 20 MG tablet Take 20 mg by mouth 2 (two) times daily.   Cholecalciferol (VITAMIN D -3) 125 MCG (5000 UT) TABS Take 5,000 mg by mouth daily as needed (random).   FLUoxetine (PROZAC) 10 MG capsule Take 10 mg by mouth daily.   valACYclovir  (VALTREX ) 1000 MG tablet Take 1,000 mg by mouth 2 (two) times daily.   No facility-administered medications prior to visit.    Review of Systems  Constitutional: Negative.  Negative for weight loss.  HENT: Negative.  Eyes: Negative.   Respiratory: Negative.    Cardiovascular: Negative.   Gastrointestinal: Negative.   Genitourinary: Negative.   Skin: Negative.   Neurological: Negative.   Endo/Heme/Allergies: Negative.        Objective:   BP 120/70   Pulse (!) 106   Temp (!) 96.6 F (35.9 C) (Tympanic)   Ht 5\' 4"  (1.626 m)   Wt 171 lb (77.6 kg)   LMP 07/05/2023 (Approximate)   SpO2 98%   BMI 29.35 kg/m   Vitals:   08/08/23 1320  BP: 120/70  Pulse: (!) 106  Temp: (!) 96.6 F (35.9 C)  Height: 5\' 4"  (1.626 m)  Weight: 171 lb (77.6 kg)  SpO2: 98%  TempSrc: Tympanic  BMI (Calculated): 29.34    Physical Exam Vitals reviewed.  Constitutional:      General: She is not in acute distress.    Appearance: She is overweight.   HENT:     Head: Normocephalic.     Nose: Nose normal.     Mouth/Throat:     Mouth: Mucous membranes are moist.  Eyes:     Extraocular Movements: Extraocular movements intact.     Pupils: Pupils are equal, round, and reactive to light.  Cardiovascular:     Rate and Rhythm: Normal rate and regular rhythm.     Heart sounds: No murmur heard. Pulmonary:     Effort: Pulmonary effort is normal.     Breath sounds: No rhonchi or rales.  Abdominal:     General: Abdomen is flat.     Palpations: There is no hepatomegaly, splenomegaly or mass.  Musculoskeletal:        General: Normal range of motion.     Cervical back: Normal range of motion. No tenderness.  Skin:    General: Skin is warm and dry.  Neurological:     General: No focal deficit present.     Mental Status: She is alert and oriented to person, place, and time.     Cranial Nerves: No cranial nerve deficit.     Motor: No weakness.  Psychiatric:        Mood and Affect: Mood normal.        Behavior: Behavior normal.      No results found for any visits on 08/08/23.  Recent Results (from the past 2160 hours)  POCT hemoglobin     Status: Normal   Collection Time: 07/11/23  3:47 PM  Result Value Ref Range   Hemoglobin 14.5 11 - 14.6 g/dL      Assessment & Plan:  As per problem list. Stricter low calorie diet, low cholesterol and low fat diet and exercise as much as possible.   Problem List Items Addressed This Visit       Hematopoietic and Hemostatic   Factor 5 Leiden mutation, heterozygous (HCC)     Other   Overweight (BMI 25.0-29.9)   Other Visit Diagnoses       Encounter for well adult exam with abnormal findings    -  Primary       Return in 3 months (on 11/05/2023).   Total time spent: 30 minutes  Arzella Bitters, MD  08/08/2023   This document may have been prepared by [facility] Voice Recognition software and as such may include unintentional dictation errors.

## 2023-08-11 ENCOUNTER — Encounter: Payer: Self-pay | Admitting: Obstetrics and Gynecology

## 2023-08-11 ENCOUNTER — Telehealth: Payer: Self-pay

## 2023-08-11 DIAGNOSIS — Z789 Other specified health status: Secondary | ICD-10-CM

## 2023-08-11 DIAGNOSIS — Z1159 Encounter for screening for other viral diseases: Secondary | ICD-10-CM

## 2023-08-12 ENCOUNTER — Telehealth: Payer: Self-pay

## 2023-08-12 NOTE — Telephone Encounter (Signed)
Patient called stating that the school told her she needs 2 doses of the Rubella or a Titer please advise if you will be putting in the titer order?

## 2023-08-12 NOTE — Addendum Note (Signed)
Addended by: Aundria Mems AHMAD on: 08/12/2023 12:44 PM   Modules accepted: Orders

## 2023-08-13 LAB — QUANTIFERON-TB GOLD PLUS
QuantiFERON Mitogen Value: >10 [IU]/mL
QuantiFERON Nil Value: 0.05 [IU]/mL
QuantiFERON TB1 Ag Value: 0.09 [IU]/mL
QuantiFERON TB2 Ag Value: 0.06 [IU]/mL
QuantiFERON-TB Gold Plus: NEGATIVE

## 2023-08-15 ENCOUNTER — Encounter: Payer: Self-pay | Admitting: Internal Medicine

## 2023-08-15 NOTE — Progress Notes (Signed)
 Patient informed.

## 2023-08-16 ENCOUNTER — Other Ambulatory Visit: Payer: Medicaid Other

## 2023-08-17 ENCOUNTER — Encounter: Payer: Self-pay | Admitting: Internal Medicine

## 2023-08-17 LAB — MEASLES/MUMPS/RUBELLA IMMUNITY
MUMPS ABS, IGG: 213 [AU]/ml (ref >10.9)
RUBEOLA AB, IGG: >300 [AU]/ml (ref >16.4)
Rubella Antibodies, IGG: 2.29 {index} (ref >0.99)

## 2023-08-18 NOTE — Addendum Note (Signed)
Addended by: Althea Grimmer B on: 08/18/2023 03:21 PM   Modules accepted: Orders

## 2023-08-19 ENCOUNTER — Other Ambulatory Visit: Payer: Medicaid Other

## 2023-08-19 DIAGNOSIS — Z789 Other specified health status: Secondary | ICD-10-CM

## 2023-08-20 LAB — VARICELLA ZOSTER ANTIBODY, IGG: Varicella zoster IgG: REACTIVE

## 2023-08-23 ENCOUNTER — Encounter: Payer: Self-pay | Admitting: Obstetrics and Gynecology

## 2023-10-17 ENCOUNTER — Ambulatory Visit: Admitting: Internal Medicine

## 2023-10-17 VITALS — BP 102/70 | HR 86 | Temp 96.8°F | Ht 64.0 in | Wt 175.0 lb

## 2023-10-17 DIAGNOSIS — I8391 Asymptomatic varicose veins of right lower extremity: Secondary | ICD-10-CM | POA: Diagnosis not present

## 2023-10-17 DIAGNOSIS — E663 Overweight: Secondary | ICD-10-CM

## 2023-10-17 DIAGNOSIS — D6851 Activated protein C resistance: Secondary | ICD-10-CM

## 2023-10-17 DIAGNOSIS — Z0130 Encounter for examination of blood pressure without abnormal findings: Secondary | ICD-10-CM

## 2023-10-17 NOTE — Progress Notes (Signed)
 Established Patient Office Visit  Subjective:  Patient ID: Cynthia Davidson, female    DOB: 02/11/1986  Age: 38 y.o. MRN: [id number]  Chief Complaint  Patient presents with   Follow-up    Follow up/discuss referral    No new complaints except worsening varicose veins on her right leg tender to touch, here for lab review and medication refills.     No other concerns at this time.   Past Medical History:  Diagnosis Date   ADD (attention deficit disorder) 09/11/2016   Anxiety attack    Factor 5 Leiden mutation, heterozygous (HCC) 07/2016   hematology [place]   Guttate psoriasis    Mastitis, associated with childbirth 02/2012   required hospitalization and IV antibiotics   Panic attack     Past Surgical History:  Procedure Laterality Date   BIOPSY BREAST Bilateral 2008   CESAREAN SECTION  02/26/2012   active phase arrest (FTP)   CESAREAN SECTION WITH BILATERAL TUBAL LIGATION Bilateral 02/12/2021   Procedure: CESAREAN SECTION WITH BILATERAL TUBAL LIGATION;  Surgeon: Abner Ables, MD;  Location: [facility];  Service: Obstetrics;  Laterality: Bilateral;   MANDIBLE SURGERY  2014   for TMJ   SINUS IRRIGATION     TONSILLECTOMY     WISDOM TOOTH EXTRACTION      Social History   Socioeconomic History   Marital status: Single    Spouse name: Not on file   Number of children: 1   Years of education: Not on file   Highest education level: Not on file  Occupational History   Occupation: LPN    Comment: works at [employer]  Tobacco Use   Smoking status: Never   Smokeless tobacco: Never  Vaping Use   Vaping status: Never Used  Substance and Sexual Activity   Alcohol use: Yes    Comment: occasional   Drug use: No   Sexual activity: Yes    Birth control/protection: Surgical    Comment: Tubal Ligation  Other Topics Concern   Not on file  Social History Narrative   Not on file   Social Drivers of Health   Financial Resource Strain: Not on file   Food Insecurity: Not on file  Transportation Needs: Not on file  Physical Activity: Not on file  Stress: Not on file  Social Connections: Not on file  Intimate Partner Violence: Not on file    Family History  Problem Relation Age of Onset   Stroke Maternal Uncle    Factor V Leiden deficiency Maternal Uncle    Stroke Paternal Aunt    Factor V Leiden deficiency Paternal Aunt    Stroke Maternal Grandfather    Melanoma Paternal Grandmother    Cerebral aneurysm Paternal Grandmother    Heart disease Paternal Grandfather    Heart attack Paternal Grandfather    Aortic aneurysm Paternal Grandfather     Allergies  Allergen Reactions   Doxycycline Anaphylaxis and Swelling   Morphine  Nausea Only    Outpatient Medications Prior to Visit  Medication Sig   amphetamine-dextroamphetamine (ADDERALL) 20 MG tablet Take 20 mg by mouth 2 (two) times daily.   Cholecalciferol (VITAMIN D -3) 125 MCG (5000 UT) TABS Take 5,000 mg by mouth daily as needed (random).   FLUoxetine (PROZAC) 10 MG capsule Take 10 mg by mouth daily.   valACYclovir  (VALTREX ) 1000 MG tablet Take 1,000 mg by mouth 2 (two) times daily.   No facility-administered medications prior to visit.    Review of Systems  Constitutional: Negative.  Negative for weight loss.  HENT: Negative.    Eyes: Negative.   Respiratory: Negative.    Cardiovascular: Negative.   Gastrointestinal: Negative.   Genitourinary: Negative.   Skin: Negative.   Neurological: Negative.   Endo/Heme/Allergies: Negative.        Objective:   BP 102/70   Pulse 86   Temp (!) 96.8 F (36 C)   Ht (Johnisha Louks) 5\' 4"  (1.626 m)   Wt 175 lb (79.4 kg)   SpO2 99%   BMI 30.04 kg/m   Vitals:   10/17/23 1549  BP: 102/70  Pulse: 86  Temp: (!) 96.8 F (36 C)  Height: (Kattia Selley) 5\' 4"  (1.626 m)  Weight: 175 lb (79.4 kg)  SpO2: 99%  BMI (Calculated): 30.02    Physical Exam Vitals reviewed.  Constitutional:      General: She is not in acute distress.     Appearance: She is overweight.  HENT:     Head: Normocephalic.     Nose: Nose normal.     Mouth/Throat:     Mouth: Mucous membranes are moist.  Eyes:     Extraocular Movements: Extraocular movements intact.     Pupils: Pupils are equal, round, and reactive to light.  Cardiovascular:     Rate and Rhythm: Normal rate and regular rhythm.     Heart sounds: No murmur heard. Pulmonary:     Effort: Pulmonary effort is normal.     Breath sounds: No rhonchi or rales.  Abdominal:     General: Abdomen is flat.     Palpations: There is no hepatomegaly, splenomegaly or mass.  Musculoskeletal:        General: Normal range of motion.     Cervical back: Normal range of motion. No tenderness.  Skin:    General: Skin is warm and dry.  Neurological:     General: No focal deficit present.     Mental Status: She is alert and oriented to person, place, and time.     Cranial Nerves: No cranial nerve deficit.     Motor: No weakness.  Psychiatric:        Mood and Affect: Mood normal.        Behavior: Behavior normal.      No results found for any visits on 10/17/23.      Assessment & Plan:  As per problem list  Problem List Items Addressed This Visit       Hematopoietic and Hemostatic   Factor 5 Leiden mutation, heterozygous (HCC)     Other   Overweight (BMI 25.0-29.9)   Other Visit Diagnoses       Varicose veins of right lower leg    -  Primary   Relevant Orders   Ambulatory referral to Vascular Surgery       Return in about 4 months (around 02/16/2024).   Total time spent: 20 minutes  Arzella Bitters, MD  10/17/2023   This document may have been prepared by [facility] Voice Recognition software and as such may include unintentional dictation errors.

## 2023-11-07 ENCOUNTER — Ambulatory Visit: Admitting: Family

## 2023-11-07 DIAGNOSIS — Z23 Encounter for immunization: Secondary | ICD-10-CM

## 2023-11-07 DIAGNOSIS — Z0000 Encounter for general adult medical examination without abnormal findings: Secondary | ICD-10-CM

## 2023-11-08 NOTE — Progress Notes (Signed)
 Patient given TDAP for nursing school  Subjective:     Patient ID: Cynthia Davidson, female   DOB: 1986-04-19, 38 y.o.   MRN: [id number]  HPI   Review of Systems     Objective:   Physical Exam     Assessment:         Plan:

## 2023-11-15 ENCOUNTER — Encounter (INDEPENDENT_AMBULATORY_CARE_PROVIDER_SITE_OTHER): Payer: Self-pay

## 2023-11-23 ENCOUNTER — Other Ambulatory Visit (INDEPENDENT_AMBULATORY_CARE_PROVIDER_SITE_OTHER): Payer: Self-pay | Admitting: Vascular Surgery

## 2023-11-23 DIAGNOSIS — I83811 Varicose veins of right lower extremities with pain: Secondary | ICD-10-CM

## 2023-11-25 ENCOUNTER — Ambulatory Visit (INDEPENDENT_AMBULATORY_CARE_PROVIDER_SITE_OTHER): Admitting: Vascular Surgery

## 2023-11-25 ENCOUNTER — Ambulatory Visit (INDEPENDENT_AMBULATORY_CARE_PROVIDER_SITE_OTHER)

## 2023-11-25 ENCOUNTER — Encounter (INDEPENDENT_AMBULATORY_CARE_PROVIDER_SITE_OTHER): Payer: Self-pay | Admitting: Vascular Surgery

## 2023-11-25 VITALS — BP 110/71 | HR 71 | Resp 18 | Ht 64.0 in | Wt 173.8 lb

## 2023-11-25 DIAGNOSIS — I83813 Varicose veins of bilateral lower extremities with pain: Secondary | ICD-10-CM

## 2023-11-25 DIAGNOSIS — D6851 Activated protein C resistance: Secondary | ICD-10-CM | POA: Diagnosis not present

## 2023-11-25 DIAGNOSIS — I83811 Varicose veins of right lower extremities with pain: Secondary | ICD-10-CM

## 2023-11-25 NOTE — Patient Instructions (Signed)
 Sclerotherapy Sclerotherapy is a procedure that is done to make varicose veins and spider veins look better and it helps to relieve aching, swelling, cramping, and pain in the legs. Varicose veins are veins that have become enlarged, bulging, and twisted due to a damaged valve that causes blood to collect (pool) in the veins. Spider veins are small varicose veins. Sclerotherapy is usually done on the legs where varicose and spider veins occur most of the time. Sclerotherapy usually works best for smaller spider and varicose veins. This procedure involves putting a chemical directly into the lining of the vein, causing it to swell and stick together. Over time, the vessel turns into scar tissue that fades from view. You may need more than one treatment to close a vein all the way. The number of veins treated in one session depends on the size and location of the veins, and on your overall medical condition. Tell a health care provider about: Any allergies you have. All medicines you are taking, including vitamins, herbs, eye drops, creams, and over-the-counter medicines. Any bleeding problems you have. Any surgeries you have had. Any medical conditions you have. Whether you are pregnant or may be pregnant. What are the risks? Your health care provider will talk with you about risks. These may include: Infection. Bleeding or blood clots. Allergic reactions to medicines or to the chemicals being used, which are called sclerosing agents. Larger treated veins becoming lumpy or hard. This may last for several months before getting better. Small sores (ulcers) forming at the injection site. Red streaking in the groin area or bruising around the injection site. Brown lines or spots at the injection site. These usually disappear within 3 to 6 months, but in rare cases they can be permanent. What happens before the procedure? Medicines Ask your health care provider about: Changing or stopping your  regular medicines. These include any diabetes medicines or blood thinners you take. Taking medicines such as aspirin and ibuprofen. These medicines can thin your blood. Do not take them unless your health care provider tells you to. Taking over-the-counter medicines, vitamins, herbs, and supplements. Tests You may have an ultrasound of the affected area to check for blood clots and to check blood flow. In rare cases, you may have an X-ray procedure to check how blood flows through your veins (angiogram). For an angiogram, a dye is injected to highlight your veins on X-rays. General instructions Do not use lotions or creams on your legs before the procedure unless your health care provider approves. Follow instructions from your health care provider about what you may eat and drink. Do not use any products that contain nicotine or tobacco before the procedure. These products include cigarettes, chewing tobacco, and vaping devices, such as e-cigarettes. If you need help quitting, ask your health care provider. Ask your health care provider what steps will be taken to help prevent infection. These steps may include: Removing hair at the injection site. Washing skin with a soap that kills germs. What happens during the procedure?  The treatment area will be cleaned. A small, thin needle is used to inject a chemical (sclerosant) into your varicose or spider veins. The sclerosant will irritate the lining of the vein and cause the vein to close below where the needle was put in. You may feel some stinging, burning, or irritation. The injection may be repeated for more than one varicose or spider vein. After the procedure, the area around where the needle was put in will be  wrapped with elastic bandages. The procedure may vary among health care providers and hospitals. What can I expect after the procedure? Your blood pressure, heart rate, breathing rate, and blood oxygen level will be monitored until  you leave the hospital or clinic. The area around the injection site will be wrapped with elastic bandages. If there is bleeding, the bandages may be changed. After the treatment, you will be able to drive yourself home. Wear compression stockings as told by your health care provider. These stockings help to prevent blood clots and reduce swelling in your legs. Contact a health care provider if: You have more redness, swelling, or pain around any injection sites. You have more fluid or blood coming from any injection sites. Any injection sites feel warm to the touch. You have pus or a bad smell coming from any injection sites. You have a fever. Get help right away if: You have leg pain that gets worse when you walk. You have redness or swelling in your leg that is getting worse. You have trouble breathing. You have chest pain. These symptoms may be an emergency. Get help right away. Call 911. Do not wait to see if the symptoms will go away. Do not drive yourself to the hospital. Summary Sclerotherapy is a procedure that is done to make varicose veins and spider veins look better and it helps to relieve aching, swelling, cramping, and pain in the legs. A small, thin needle is used to inject a chemical (sclerosant) into a spider vein or varicose vein to close it. Elastic bandages will be wrapped around any injection sites after the procedure. Wear compression stockings as told by your health care provider. These stockings help to prevent blood clots and reduce swelling in your legs. This information is not intended to replace advice given to you by your health care provider. Make sure you discuss any questions you have with your health care provider. Document Revised: 09/17/2021 Document Reviewed: 09/17/2021 Elsevier Patient Education  2024 [employer].

## 2023-11-25 NOTE — Progress Notes (Signed)
 Patient ID: Cynthia Davidson, female   DOB: February 13, 1986, 38 y.o.   MRN: [id number]  Chief Complaint  Patient presents with   New Patient (Initial Visit)    Ref Tejan-Sie consult rle varicose veins with pain    HPI Cynthia Davidson is a 38 y.o. female.  I am asked to see the patient by Dr. Tejan-Sie for evaluation of painful varicose veins of the lower extremities.  These are worse on the right than the left.  She has a history of factor V Leiden mutation and has been very concerned about the enlarging varicosities on her right leg.  She has worn compression socks for many months without significant improvement.  She elevates her legs.  She frequently takes anti-inflammatories for the discomfort.  Does not have significant swelling at this point.  No known history of DVT or superficial thrombophlebitis, but she has been checked and does in fact have factor V Leiden mutation.  We performed a venous reflux study of the right lower extremity today.  There was no evidence of deep venous thrombosis, superficial thrombophlebitis, or significant venous reflux.     Past Medical History:  Diagnosis Date   ADD (attention deficit disorder) 09/11/2016   Anxiety attack    Factor 5 Leiden mutation, heterozygous (HCC) 07/2016   hematology [place]   Guttate psoriasis    Mastitis, associated with childbirth 02/2012   required hospitalization and IV antibiotics   Panic attack     Past Surgical History:  Procedure Laterality Date   BIOPSY BREAST Bilateral 2008   CESAREAN SECTION  02/26/2012   active phase arrest (FTP)   CESAREAN SECTION WITH BILATERAL TUBAL LIGATION Bilateral 02/12/2021   Procedure: CESAREAN SECTION WITH BILATERAL TUBAL LIGATION;  Surgeon: Abner Ables, MD;  Location: [facility] LD OR[facility];  Service: Obstetrics;  Laterality: Bilateral;   MANDIBLE SURGERY  2014   for TMJ   SINUS IRRIGATION     TONSILLECTOMY     WISDOM TOOTH EXTRACTION       Family History  Problem  Relation Age of Onset   Stroke Maternal Uncle    Factor V Leiden deficiency Maternal Uncle    Stroke Paternal Aunt    Factor V Leiden deficiency Paternal Aunt    Stroke Maternal Grandfather    Melanoma Paternal Grandmother    Cerebral aneurysm Paternal Grandmother    Heart disease Paternal Grandfather    Heart attack Paternal Grandfather    Aortic aneurysm Paternal Grandfather       Social History   Tobacco Use   Smoking status: Never   Smokeless tobacco: Never  Vaping Use   Vaping status: Never Used  Substance Use Topics   Alcohol use: Yes    Comment: occasional   Drug use: No     Allergies  Allergen Reactions   Doxycycline Anaphylaxis and Swelling   Morphine  Nausea Only    Current Outpatient Medications  Medication Sig Dispense Refill   amphetamine-dextroamphetamine (ADDERALL) 20 MG tablet Take 20 mg by mouth 2 (two) times daily.     Cholecalciferol (VITAMIN D -3) 125 MCG (5000 UT) TABS Take 5,000 mg by mouth daily as needed (random).     FLUoxetine (PROZAC) 10 MG capsule Take 10 mg by mouth daily.     valACYclovir  (VALTREX ) 1000 MG tablet Take 1,000 mg by mouth as needed.     No current facility-administered medications for this visit.      REVIEW OF SYSTEMS (Negative unless checked)  Constitutional: [] Weight  loss  [] Fever  [] Chills Cardiac: [] Chest pain   [] Chest pressure   [] Palpitations   [] Shortness of breath when laying flat   [] Shortness of breath at rest   [] Shortness of breath with exertion. Vascular:  [x] Pain in legs with walking   [x] Pain in legs at rest   [] Pain in legs when laying flat   [] Claudication   [] Pain in feet when walking  [] Pain in feet at rest  [] Pain in feet when laying flat   [] History of DVT   [] Phlebitis   [] Swelling in legs   [x] Varicose veins   [] Non-healing ulcers Pulmonary:   [] Uses home oxygen   [] Productive cough   [] Hemoptysis   [] Wheeze  [] COPD   [] Asthma Neurologic:  [] Dizziness  [] Blackouts   [] Seizures   [] History of stroke    [] History of TIA  [] Aphasia   [] Temporary blindness   [] Dysphagia   [] Weakness or numbness in arms   [] Weakness or numbness in legs Musculoskeletal:  [] Arthritis   [] Joint swelling   [] Joint pain   [] Low back pain Hematologic:  [] Easy bruising  [] Easy bleeding   [x] Hypercoagulable state   [] Anemic  [] Hepatitis Gastrointestinal:  [] Blood in stool   [] Vomiting blood  [] Gastroesophageal reflux/heartburn   [] Abdominal pain Genitourinary:  [] Chronic kidney disease   [] Difficult urination  [] Frequent urination  [] Burning with urination   [] Hematuria Skin:  [] Rashes   [] Ulcers   [] Wounds Psychological:  [x] History of anxiety   []  History of major depression.    Physical Exam BP 110/71   Pulse 71   Resp 18   Ht 5\' 4"  (1.626 m)   Wt 173 lb 12.8 oz (78.8 kg)   BMI 29.83 kg/m  Gen:  WD/WN, NAD Head: [place]/AT, No temporalis wasting.  Ear/Nose/Throat: Hearing grossly intact, nares w/o erythema or drainage, oropharynx w/o Erythema/Exudate Eyes: Conjunctiva clear, sclera non-icteric  Neck: trachea midline.  No JVD.  Pulmonary:  Good air movement, respirations not labored, no use of accessory muscles  Cardiac: RRR, no JVD Vascular:  Vessel Right Left  Radial Palpable Palpable                                   Gastrointestinal:. No masses, surgical incisions, or scars. Musculoskeletal: M/S 5/5 throughout.  Extremities without ischemic changes.  No deformity or atrophy.  Scattered varicosities present bilaterally.  Some of these are locally tender especially on the right leg.  No appreciable lower extremity edema. Neurologic: Sensation grossly intact in extremities.  Symmetrical.  Speech is fluent. Motor exam as listed above. Psychiatric: Judgment intact, Mood & affect appropriate for pt's clinical situation. Dermatologic: No rashes or ulcers noted.  No cellulitis or open wounds.    Radiology No results found.  Labs No results found for this or any previous visit (from the past 2160  hours).  Assessment/Plan:  Varicose veins of leg with pain, bilateral Patient has CEAP class III venous disease with painful varicosities.  She did not have significant reflux or thrombotic issues noted on duplex.  She has done appropriate conservative therapies of compression socks, elevation, and anti-inflammatories.  She exercises and is a normal weight.  She would benefit from sclerotherapy of the affected varicosities on both lower extremities.  Risks and benefits were discussed and we will begin the preapproval process.  Factor 5 Leiden mutation, heterozygous (HCC) No current thrombotic issues but has to have heightened awareness for possible thrombotic problems.  Mikki Alexander 11/25/2023, 9:39 AM   This note was created with Dragon medical transcription system.  Any errors from dictation are unintentional.

## 2023-11-25 NOTE — Assessment & Plan Note (Signed)
 Patient has CEAP class III venous disease with painful varicosities.  She did not have significant reflux or thrombotic issues noted on duplex.  She has done appropriate conservative therapies of compression socks, elevation, and anti-inflammatories.  She exercises and is a normal weight.  She would benefit from sclerotherapy of the affected varicosities on both lower extremities.  Risks and benefits were discussed and we will begin the preapproval process.

## 2023-11-25 NOTE — Assessment & Plan Note (Signed)
 No current thrombotic issues but has to have heightened awareness for possible thrombotic problems.

## 2023-12-14 ENCOUNTER — Encounter: Payer: Self-pay | Admitting: Internal Medicine

## 2023-12-14 ENCOUNTER — Ambulatory Visit (INDEPENDENT_AMBULATORY_CARE_PROVIDER_SITE_OTHER): Admitting: Internal Medicine

## 2023-12-14 VITALS — BP 98/68 | HR 88 | Temp 96.4°F | Ht 64.0 in | Wt 169.6 lb

## 2023-12-14 DIAGNOSIS — I8391 Asymptomatic varicose veins of right lower extremity: Secondary | ICD-10-CM

## 2023-12-14 DIAGNOSIS — Z0130 Encounter for examination of blood pressure without abnormal findings: Secondary | ICD-10-CM

## 2023-12-14 DIAGNOSIS — E663 Overweight: Secondary | ICD-10-CM | POA: Diagnosis not present

## 2023-12-14 MED ORDER — WEGOVY 0.5 MG/0.5ML ~~LOC~~ SOAJ
0.5000 mg | SUBCUTANEOUS | 1 refills | Status: AC
Start: 2023-12-14 — End: 2024-02-08

## 2023-12-14 NOTE — Progress Notes (Signed)
 Established Patient Office Visit  Subjective:  Patient ID: Cynthia Davidson, female    DOB: 1986/06/03  Age: 38 y.o. MRN: [id number]  Chief Complaint  Patient presents with   Follow-up    1 week wegovy  followup    No complaints tolerating Wegovy  with modest weight loss.     No other concerns at this time.   Past Medical History:  Diagnosis Date   ADD (attention deficit disorder) 09/11/2016   Anxiety attack    Factor 5 Leiden mutation, heterozygous (HCC) 07/2016   hematology [place]   Guttate psoriasis    Mastitis, associated with childbirth 02/2012   required hospitalization and IV antibiotics   Panic attack     Past Surgical History:  Procedure Laterality Date   BIOPSY BREAST Bilateral 2008   CESAREAN SECTION  02/26/2012   active phase arrest (FTP)   CESAREAN SECTION WITH BILATERAL TUBAL LIGATION Bilateral 02/12/2021   Procedure: CESAREAN SECTION WITH BILATERAL TUBAL LIGATION;  Surgeon: Abner Ables, MD;  Location: [facility];  Service: Obstetrics;  Laterality: Bilateral;   MANDIBLE SURGERY  2014   for TMJ   SINUS IRRIGATION     TONSILLECTOMY     WISDOM TOOTH EXTRACTION      Social History   Socioeconomic History   Marital status: Single    Spouse name: Not on file   Number of children: 1   Years of education: Not on file   Highest education level: Not on file  Occupational History   Occupation: LPN    Comment: works at [employer]  Tobacco Use   Smoking status: Never   Smokeless tobacco: Never  Vaping Use   Vaping status: Never Used  Substance and Sexual Activity   Alcohol use: Yes    Comment: occasional   Drug use: No   Sexual activity: Yes    Birth control/protection: Surgical    Comment: Tubal Ligation  Other Topics Concern   Not on file  Social History Narrative   Not on file   Social Drivers of Health   Financial Resource Strain: Not on file  Food Insecurity: Not on file  Transportation Needs: Not on file  Physical  Activity: Not on file  Stress: Not on file  Social Connections: Not on file  Intimate Partner Violence: Not on file    Family History  Problem Relation Age of Onset   Stroke Maternal Uncle    Factor V Leiden deficiency Maternal Uncle    Stroke Paternal Aunt    Factor V Leiden deficiency Paternal Aunt    Stroke Maternal Grandfather    Melanoma Paternal Grandmother    Cerebral aneurysm Paternal Grandmother    Heart disease Paternal Grandfather    Heart attack Paternal Grandfather    Aortic aneurysm Paternal Grandfather     Allergies  Allergen Reactions   Doxycycline Anaphylaxis and Swelling   Morphine  Nausea Only    Outpatient Medications Prior to Visit  Medication Sig   amphetamine-dextroamphetamine (ADDERALL) 20 MG tablet Take 20 mg by mouth 2 (two) times daily.   buPROPion (WELLBUTRIN XL) 150 MG 24 hr tablet Take 150 mg by mouth every morning.   Cholecalciferol (VITAMIN D -3) 125 MCG (5000 UT) TABS Take 5,000 mg by mouth daily as needed (random).   FLUoxetine (PROZAC) 10 MG capsule Take 10 mg by mouth daily.   [DISCONTINUED] WEGOVY  0.25 MG/0.5ML SOAJ SMARTSIG:0.25 Milligram(Isaia Hassell) SUB-Q Once a Week   valACYclovir  (VALTREX ) 1000 MG tablet Take 1,000 mg by mouth  as needed. (Patient not taking: Reported on 12/14/2023)   No facility-administered medications prior to visit.    Review of Systems  Constitutional:  Positive for weight loss (4 lbs).  HENT: Negative.    Eyes: Negative.   Respiratory: Negative.    Cardiovascular: Negative.   Gastrointestinal: Negative.   Genitourinary: Negative.   Skin: Negative.   Neurological: Negative.   Endo/Heme/Allergies: Negative.        Objective:   BP 98/68 (Cuff Size: Normal)   Pulse 88   Temp (!) 96.4 F (35.8 C)   Ht 5' 4 (1.626 m)   Wt 169 lb 9.6 oz (76.9 kg)   LMP 11/23/2023 (Approximate)   SpO2 97%   BMI 29.11 kg/m   Vitals:   12/14/23 0921 12/14/23 0941  BP:  98/68  Pulse: 88   Temp: (!) 96.4 F (35.8 C)    Height: 5' 4 (1.626 m)   Weight: 169 lb 9.6 oz (76.9 kg)   SpO2: 97%   BMI (Calculated): 29.1     Physical Exam Vitals reviewed.  Constitutional:      General: She is not in acute distress.    Appearance: She is overweight.  HENT:     Head: Normocephalic.     Nose: Nose normal.     Mouth/Throat:     Mouth: Mucous membranes are moist.   Eyes:     Extraocular Movements: Extraocular movements intact.     Pupils: Pupils are equal, round, and reactive to light.    Cardiovascular:     Rate and Rhythm: Normal rate and regular rhythm.     Heart sounds: No murmur heard.    Comments: R thigh varicose veins Pulmonary:     Effort: Pulmonary effort is normal.     Breath sounds: No rhonchi or rales.  Abdominal:     General: Abdomen is flat.     Palpations: There is no hepatomegaly, splenomegaly or mass.   Musculoskeletal:        General: Normal range of motion.     Cervical back: Normal range of motion. No tenderness.   Skin:    General: Skin is warm and dry.   Neurological:     General: No focal deficit present.     Mental Status: She is alert and oriented to person, place, and time.     Cranial Nerves: No cranial nerve deficit.     Motor: No weakness.   Psychiatric:        Mood and Affect: Mood normal.        Behavior: Behavior normal.      No results found for any visits on 12/14/23.  No results found for this or any previous visit (from the past 2160 hours).    Assessment & Plan:  As per problem list  Problem List Items Addressed This Visit       Other   Overweight (BMI 25.0-29.9) - Primary   Relevant Medications   Semaglutide -Weight Management (WEGOVY ) 0.5 MG/0.5ML SOAJ   Other Visit Diagnoses       Varicose veins of right lower leg           Return in about 2 months (around 02/13/2024) for Weight management.   Total time spent: 20 minutes  Arzella Bitters, MD  12/14/2023   This document may have been prepared by [facility] Voice  Recognition software and as such may include unintentional dictation errors.

## 2024-01-09 NOTE — Telephone Encounter (Signed)
 error

## 2024-02-15 ENCOUNTER — Ambulatory Visit: Payer: Medicaid Other | Admitting: Internal Medicine

## 2024-02-28 ENCOUNTER — Telehealth: Payer: Self-pay

## 2024-02-28 ENCOUNTER — Other Ambulatory Visit: Payer: Self-pay | Admitting: Internal Medicine

## 2024-02-28 DIAGNOSIS — E663 Overweight: Secondary | ICD-10-CM

## 2024-02-28 MED ORDER — WEGOVY 1 MG/0.5ML ~~LOC~~ SOAJ: 1.0000 mg | SUBCUTANEOUS | 0 refills | Status: DC

## 2024-02-28 NOTE — Telephone Encounter (Signed)
 Patient is requesting to get an increased dose of the wegovy  called in, she states that she is due for her next dose Thursday but she can't see you until Monday.

## 2024-03-05 ENCOUNTER — Ambulatory Visit: Admitting: Internal Medicine

## 2024-03-05 VITALS — BP 112/70 | HR 96 | Ht 64.0 in | Wt 164.0 lb

## 2024-03-05 DIAGNOSIS — F908 Attention-deficit hyperactivity disorder, other type: Secondary | ICD-10-CM | POA: Diagnosis not present

## 2024-03-05 DIAGNOSIS — E663 Overweight: Secondary | ICD-10-CM

## 2024-03-05 DIAGNOSIS — J0100 Acute maxillary sinusitis, unspecified: Secondary | ICD-10-CM

## 2024-03-05 NOTE — Progress Notes (Signed)
 Established Patient Office Visit  Subjective:  Patient ID: Cynthia Davidson, female    DOB: 1985/09/11  Age: 38 y.o. MRN: [id number]  Chief Complaint  Patient presents with   Follow-up    2 month lab results    Here for weight management, lost 5 lbs and c/o abdominal discomfort on current dose of Wegovy .    No other concerns at this time.   Past Medical History:  Diagnosis Date   ADD (attention deficit disorder) 09/11/2016   Anxiety attack    Factor 5 Leiden mutation, heterozygous (HCC) 07/2016   hematology [place]   Guttate psoriasis    Mastitis, associated with childbirth 02/2012   required hospitalization and IV antibiotics   Panic attack     Past Surgical History:  Procedure Laterality Date   BIOPSY BREAST Bilateral 2008   CESAREAN SECTION  02/26/2012   active phase arrest (FTP)   CESAREAN SECTION WITH BILATERAL TUBAL LIGATION Bilateral 02/12/2021   Procedure: CESAREAN SECTION WITH BILATERAL TUBAL LIGATION;  Surgeon: Eldonna Suzen Octave, MD;  Location: [facility];  Service: Obstetrics;  Laterality: Bilateral;   MANDIBLE SURGERY  2014   for TMJ   SINUS IRRIGATION     TONSILLECTOMY     WISDOM TOOTH EXTRACTION      Social History   Socioeconomic History   Marital status: Single    Spouse name: Not on file   Number of children: 1   Years of education: Not on file   Highest education level: Not on file  Occupational History   Occupation: LPN    Comment: works at [employer]  Tobacco Use   Smoking status: Never   Smokeless tobacco: Never  Vaping Use   Vaping status: Never Used  Substance and Sexual Activity   Alcohol use: Yes    Comment: occasional   Drug use: No   Sexual activity: Yes    Birth control/protection: Surgical    Comment: Tubal Ligation  Other Topics Concern   Not on file  Social History Narrative   Not on file   Social Drivers of Health   Financial Resource Strain: Not on file  Food Insecurity: Not on file  Transportation  Needs: Not on file  Physical Activity: Not on file  Stress: Not on file  Social Connections: Not on file  Intimate Partner Violence: Not on file    Family History  Problem Relation Age of Onset   Stroke Maternal Uncle    Factor V Leiden deficiency Maternal Uncle    Stroke Paternal Aunt    Factor V Leiden deficiency Paternal Aunt    Stroke Maternal Grandfather    Melanoma Paternal Grandmother    Cerebral aneurysm Paternal Grandmother    Heart disease Paternal Grandfather    Heart attack Paternal Grandfather    Aortic aneurysm Paternal Grandfather     Allergies  Allergen Reactions   Doxycycline Anaphylaxis and Swelling   Morphine  Nausea Only    Outpatient Medications Prior to Visit  Medication Sig   amphetamine-dextroamphetamine (ADDERALL) 20 MG tablet Take 20 mg by mouth 2 (two) times daily.   buPROPion (WELLBUTRIN XL) 150 MG 24 hr tablet Take 150 mg by mouth every morning.   Cholecalciferol (VITAMIN D -3) 125 MCG (5000 UT) TABS Take 5,000 mg by mouth daily as needed (random).   FLUoxetine (PROZAC) 10 MG capsule Take 10 mg by mouth daily.   semaglutide -weight management (WEGOVY ) 1 MG/0.5ML SOAJ SQ injection Inject 1 mg into the skin once a  week for 28 days.   valACYclovir  (VALTREX ) 1000 MG tablet Take 1,000 mg by mouth as needed. (Patient not taking: Reported on 12/14/2023)   No facility-administered medications prior to visit.    Review of Systems  Constitutional:  Positive for weight loss (5 lbs).  HENT: Negative.    Eyes: Negative.   Respiratory: Negative.    Cardiovascular: Negative.   Gastrointestinal: Negative.   Genitourinary: Negative.   Skin: Negative.   Neurological: Negative.   Endo/Heme/Allergies: Negative.        Objective:   BP 112/70   Pulse 96   Ht 5' 4 (1.626 m)   Wt 164 lb (74.4 kg)   SpO2 98%   BMI 28.15 kg/m   Vitals:   03/05/24 1535  BP: 112/70  Pulse: 96  Height: 5' 4 (1.626 m)  Weight: 164 lb (74.4 kg)  SpO2: 98%  BMI  (Calculated): 28.14    Physical Exam Vitals reviewed.  Constitutional:      General: She is not in acute distress.    Appearance: She is overweight.  HENT:     Head: Normocephalic.     Nose: Nose normal.     Mouth/Throat:     Mouth: Mucous membranes are moist.  Eyes:     Extraocular Movements: Extraocular movements intact.     Pupils: Pupils are equal, round, and reactive to light.  Cardiovascular:     Rate and Rhythm: Normal rate and regular rhythm.     Heart sounds: No murmur heard.    Comments: R thigh varicose veins Pulmonary:     Effort: Pulmonary effort is normal.     Breath sounds: No rhonchi or rales.  Abdominal:     General: Abdomen is flat.     Palpations: There is no hepatomegaly, splenomegaly or mass.  Musculoskeletal:        General: Normal range of motion.     Cervical back: Normal range of motion. No tenderness.  Skin:    General: Skin is warm and dry.  Neurological:     General: No focal deficit present.     Mental Status: She is alert and oriented to person, place, and time.     Cranial Nerves: No cranial nerve deficit.     Motor: No weakness.  Psychiatric:        Mood and Affect: Mood normal.        Behavior: Behavior normal.      No results found for any visits on 03/05/24.  No results found for this or any previous visit (from the past 2160 hours).    Assessment & Plan:  As per problem list. Stricter low calorie diet and exercise as much as possible  Problem List Items Addressed This Visit       Other   Other specified attention deficit hyperactivity disorder (ADHD)   Overweight (BMI 25.0-29.9) - Primary   Other Visit Diagnoses       Acute non-recurrent maxillary sinusitis           Return in about 2 months (around 05/05/2024) for Weight management.   Total time spent: 20 minutes  Sherrill Cinderella Perry, MD  03/05/2024   This document may have been prepared by [facility] Voice Recognition software and as such may include  unintentional dictation errors.

## 2024-04-05 ENCOUNTER — Other Ambulatory Visit: Payer: Self-pay | Admitting: Internal Medicine

## 2024-04-05 DIAGNOSIS — E663 Overweight: Secondary | ICD-10-CM

## 2024-04-06 ENCOUNTER — Telehealth: Payer: Self-pay

## 2024-04-06 ENCOUNTER — Other Ambulatory Visit: Payer: Self-pay | Admitting: Internal Medicine

## 2024-04-06 DIAGNOSIS — E663 Overweight: Secondary | ICD-10-CM

## 2024-04-06 NOTE — Telephone Encounter (Signed)
 Patient called asking for her Wegovy  to be refilled, she has medicaid and they have stopped covering the weightloss drug as of 03/28/24 with no other co morbidities. I will call the patient and let her know

## 2024-04-06 NOTE — Telephone Encounter (Signed)
 Patient informed and she would like for me to try the PA for her Tachycardia and Factor 5 to see if it gets approved

## 2024-04-09 ENCOUNTER — Other Ambulatory Visit: Payer: Self-pay | Admitting: Internal Medicine

## 2024-04-09 DIAGNOSIS — E663 Overweight: Secondary | ICD-10-CM

## 2024-04-12 NOTE — Telephone Encounter (Signed)
 Pt LM asking for me to call back she also wants me to attach a letter with the appeal I send for her weightloss injection

## 2024-04-13 ENCOUNTER — Encounter: Payer: Self-pay | Admitting: Internal Medicine

## 2024-04-13 NOTE — Telephone Encounter (Signed)
 Spoke with patient, she wrote a letter to send with the appeal for when it gets denied she will send it via mychart

## 2024-04-13 NOTE — Telephone Encounter (Signed)
 I told her I would work on the PA I just havent yet, I will try to work on it today if possible

## 2024-04-23 ENCOUNTER — Encounter: Payer: Self-pay | Admitting: Internal Medicine

## 2024-05-09 ENCOUNTER — Ambulatory Visit: Admitting: Internal Medicine

## 2024-05-11 ENCOUNTER — Encounter: Payer: Self-pay | Admitting: Internal Medicine

## 2024-05-11 ENCOUNTER — Ambulatory Visit: Admitting: Internal Medicine

## 2024-05-11 VITALS — BP 118/72 | HR 105 | Temp 98.0°F | Ht 64.0 in | Wt 171.8 lb

## 2024-05-11 DIAGNOSIS — Z1331 Encounter for screening for depression: Secondary | ICD-10-CM

## 2024-05-11 DIAGNOSIS — Z0001 Encounter for general adult medical examination with abnormal findings: Secondary | ICD-10-CM

## 2024-05-11 DIAGNOSIS — Z0000 Encounter for general adult medical examination without abnormal findings: Secondary | ICD-10-CM

## 2024-05-11 DIAGNOSIS — Z0130 Encounter for examination of blood pressure without abnormal findings: Secondary | ICD-10-CM

## 2024-05-11 DIAGNOSIS — E663 Overweight: Secondary | ICD-10-CM

## 2024-05-11 NOTE — Progress Notes (Signed)
 Established Patient Office Visit  Subjective:  Patient ID: Cynthia Davidson, female    DOB: 16-Jan-1986  Age: 38 y.o. MRN: [id number]  Chief Complaint  Patient presents with   Annual Exam    Physical, 2 month follow up, needs pap next visit.    No new complaints, here for lab review and medication refills. Unable to tolerate prior appetite suppressants due to the fact she is already on amphetamines. Has gained weight since Wegovy  was denied by insurance.     No other concerns at this time.   Past Medical History:  Diagnosis Date   ADD (attention deficit disorder) 09/11/2016   Anxiety attack    Factor 5 Leiden mutation, heterozygous 07/2016   hematology [place]   Guttate psoriasis    Mastitis, associated with childbirth 02/2012   required hospitalization and IV antibiotics   Panic attack     Past Surgical History:  Procedure Laterality Date   BIOPSY BREAST Bilateral 2008   CESAREAN SECTION  02/26/2012   active phase arrest (FTP)   CESAREAN SECTION WITH BILATERAL TUBAL LIGATION Bilateral 02/12/2021   Procedure: CESAREAN SECTION WITH BILATERAL TUBAL LIGATION;  Surgeon: Eldonna Suzen Octave, MD;  Location: [facility];  Service: Obstetrics;  Laterality: Bilateral;   MANDIBLE SURGERY  2014   for TMJ   SINUS IRRIGATION     TONSILLECTOMY     WISDOM TOOTH EXTRACTION      Social History   Socioeconomic History   Marital status: Single    Spouse name: Not on file   Number of children: 1   Years of education: Not on file   Highest education level: Not on file  Occupational History   Occupation: LPN    Comment: works at [employer]  Tobacco Use   Smoking status: Never   Smokeless tobacco: Never  Vaping Use   Vaping status: Never Used  Substance and Sexual Activity   Alcohol use: Yes    Comment: occasional   Drug use: No   Sexual activity: Yes    Birth control/protection: Surgical    Comment: Tubal Ligation  Other Topics Concern   Not on file  Social  History Narrative   Not on file   Social Drivers of Health   Financial Resource Strain: Not on file  Food Insecurity: Not on file  Transportation Needs: Not on file  Physical Activity: Not on file  Stress: Not on file  Social Connections: Not on file  Intimate Partner Violence: Not on file    Family History  Problem Relation Age of Onset   Stroke Maternal Uncle    Factor V Leiden deficiency Maternal Uncle    Stroke Paternal Aunt    Factor V Leiden deficiency Paternal Aunt    Stroke Maternal Grandfather    Melanoma Paternal Grandmother    Cerebral aneurysm Paternal Grandmother    Heart disease Paternal Grandfather    Heart attack Paternal Grandfather    Aortic aneurysm Paternal Grandfather     Allergies  Allergen Reactions   Doxycycline Anaphylaxis and Swelling   Morphine  Nausea Only    Outpatient Medications Prior to Visit  Medication Sig   amphetamine-dextroamphetamine (ADDERALL) 20 MG tablet Take 20 mg by mouth 2 (two) times daily.   Cholecalciferol (VITAMIN D -3) 125 MCG (5000 UT) TABS Take 5,000 mg by mouth daily as needed (random).   FLUoxetine (PROZAC) 10 MG capsule Take 10 mg by mouth daily.   buPROPion (WELLBUTRIN XL) 150 MG 24 hr tablet Take  150 mg by mouth every morning. (Patient not taking: Reported on 05/11/2024)   tirzepatide (ZEPBOUND) 2.5 MG/0.5ML injection vial Inject 2.5 mg into the skin once a week. (Patient not taking: Reported on 05/11/2024)   valACYclovir  (VALTREX ) 1000 MG tablet Take 1,000 mg by mouth as needed. (Patient not taking: Reported on 05/11/2024)   No facility-administered medications prior to visit.    Review of Systems  Constitutional:  Positive for weight loss (5 lbs).  HENT: Negative.    Eyes: Negative.   Respiratory: Negative.    Cardiovascular: Negative.   Gastrointestinal: Negative.   Genitourinary: Negative.   Skin: Negative.   Neurological: Negative.   Endo/Heme/Allergies: Negative.        Objective:   BP 118/72    Pulse (!) 105   Temp 98 F (36.7 C)   Ht 5' 4 (1.626 m)   Wt 171 lb 12.8 oz (77.9 kg)   SpO2 98%   BMI 29.49 kg/m   Vitals:   05/11/24 1023  BP: 118/72  Pulse: (!) 105  Temp: 98 F (36.7 C)  Height: 5' 4 (1.626 m)  Weight: 171 lb 12.8 oz (77.9 kg)  SpO2: 98%  BMI (Calculated): 29.47    Physical Exam Vitals reviewed.  Constitutional:      General: She is not in acute distress.    Appearance: She is overweight.  HENT:     Head: Normocephalic.     Nose: Nose normal.     Mouth/Throat:     Mouth: Mucous membranes are moist.  Eyes:     Extraocular Movements: Extraocular movements intact.     Pupils: Pupils are equal, round, and reactive to light.  Cardiovascular:     Rate and Rhythm: Normal rate and regular rhythm.     Heart sounds: No murmur heard.    Comments: R thigh varicose veins Pulmonary:     Effort: Pulmonary effort is normal.     Breath sounds: No rhonchi or rales.  Abdominal:     General: Abdomen is flat.     Palpations: There is no hepatomegaly, splenomegaly or mass.  Musculoskeletal:        General: Normal range of motion.     Cervical back: Normal range of motion. No tenderness.  Skin:    General: Skin is warm and dry.  Neurological:     General: No focal deficit present.     Mental Status: She is alert and oriented to person, place, and time.     Cranial Nerves: No cranial nerve deficit.     Motor: No weakness.  Psychiatric:        Mood and Affect: Mood normal.        Behavior: Behavior normal.      No results found for any visits on 05/11/24.  No results found for this or any previous visit (from the past 2160 hours).    Assessment & Plan:  Carrine was seen today for annual exam.  Encounter for general adult medical examination without abnormal findings -     QuantiFERON-TB Gold Plus -     CBC With Diff/Platelet  Overweight (BMI 25.0-29.9)    Problem List Items Addressed This Visit       Other   Overweight (BMI 25.0-29.9)    Other Visit Diagnoses       Encounter for general adult medical examination without abnormal findings    -  Primary   Relevant Orders   QuantiFERON-TB Gold Plus   CBC With Diff/Platelet  Medically necessary to receive Zepbound as being overweight is a risk for thrombotic disease due to her history of Factor V Leiden.  Return in about 3 months (around 08/11/2024) for Weight management.   Total time spent: 30 minutes. This time includes review of previous notes and results and patient face to face interaction during today'Ahri Olson visit.    Sherrill Cinderella Perry, MD  05/11/2024   This document may have been prepared by [facility] Voice Recognition software and as such may include unintentional dictation errors.

## 2024-05-12 ENCOUNTER — Telehealth: Admitting: Physician Assistant

## 2024-05-12 DIAGNOSIS — N3000 Acute cystitis without hematuria: Secondary | ICD-10-CM

## 2024-05-12 MED ORDER — NITROFURANTOIN MONOHYD MACRO 100 MG PO CAPS: 100.0000 mg | ORAL_CAPSULE | Freq: Two times a day (BID) | ORAL | 0 refills | Status: AC

## 2024-05-12 NOTE — Progress Notes (Signed)

## 2024-06-07 ENCOUNTER — Telehealth: Admitting: Physician Assistant

## 2024-06-07 DIAGNOSIS — B9789 Other viral agents as the cause of diseases classified elsewhere: Secondary | ICD-10-CM

## 2024-06-08 MED ORDER — IPRATROPIUM BROMIDE 0.03 % NA SOLN: 2.0000 | Freq: Two times a day (BID) | NASAL | 0 refills | Status: AC

## 2024-06-08 NOTE — Progress Notes (Signed)
 We are sorry that you are not feeling well.  Here is how we plan to help!  Based on what you have shared with me it looks like you have sinusitis.  Sinusitis is inflammation and infection in the sinus cavities of the head.  Based on your presentation I believe you most likely have Acute Viral Sinusitis.This is an infection most likely caused by a virus. There is not specific treatment for viral sinusitis other than to help you with the symptoms until the infection runs its course.  You may use an oral decongestant such as Mucinex D or if you have glaucoma or high blood pressure use plain Mucinex. Saline nasal spray help and can safely be used as often as needed for congestion, I have prescribed: Ipratropium Bromide nasal spray 0.03% 2 sprays in eah nostril 2-3 times a day  Some authorities believe that zinc sprays or the use of Echinacea may shorten the course of your symptoms.  Sinus infections are not as easily transmitted as other respiratory infection, however we still recommend that you avoid close contact with loved ones, especially the very young and elderly.  Remember to wash your hands thoroughly throughout the day as this is the number one way to prevent the spread of infection!  Home Care: Only take medications as instructed by your medical team. Do not take these medications with alcohol. A steam or ultrasonic humidifier can help congestion.  You can place a towel over your head and breathe in the steam from hot water coming from a faucet. Avoid close contacts especially the very young and the elderly. Cover your mouth when you cough or sneeze. Always remember to wash your hands.  Get Help Right Away If: You develop worsening fever or sinus pain. You develop a severe head ache or visual changes. Your symptoms persist after you have completed your treatment plan.  Make sure you Understand these instructions. Will watch your condition. Will get help right away if you are not doing  well or get worse.  Your e-visit answers were reviewed by a board certified advanced clinical practitioner to complete your personal care plan.  Depending on the condition, your plan could have included both over the counter or prescription medications.  If there is a problem please reply  once you have received a response from your provider.  Your safety is important to us .  If you have drug allergies check your prescription carefully.    You can use MyChart to ask questions about today's visit, request a non-urgent call back, or ask for a work or school excuse for 24 hours related to this e-Visit. If it has been greater than 24 hours you will need to follow up with your provider, or enter a new e-Visit to address those concerns.  You will get an e-mail in the next two days asking about your experience.  I hope that your e-visit has been valuable and will speed your recovery. Thank you for using e-visits.  I have spent 5 minutes in review of e-visit questionnaire, review and updating patient chart, medical decision making and response to patient.   Delon CHRISTELLA Dickinson, PA-C

## 2024-06-19 ENCOUNTER — Other Ambulatory Visit: Payer: Self-pay

## 2024-06-19 ENCOUNTER — Other Ambulatory Visit

## 2024-06-19 LAB — CBC WITH DIFF/PLATELET
Basophils Absolute: 0 x10E3/uL (ref 0.0–0.2)
Basos: 1 %
EOS (ABSOLUTE): 0.1 x10E3/uL (ref 0.0–0.4)
Eos: 2 %
Hematocrit: 39.2 % (ref 34.0–46.6)
Hemoglobin: 13.1 g/dL (ref 11.1–15.9)
Immature Grans (Abs): 0 x10E3/uL (ref 0.0–0.1)
Immature Granulocytes: 0 %
Lymphocytes Absolute: 1.9 x10E3/uL (ref 0.7–3.1)
Lymphs: 34 %
MCH: 32.8 pg (ref 26.6–33.0)
MCHC: 33.4 g/dL (ref 31.5–35.7)
MCV: 98 fL — ABNORMAL HIGH (ref 79–97)
Monocytes Absolute: 0.5 x10E3/uL (ref 0.1–0.9)
Monocytes: 9 %
Neutrophils Absolute: 3.1 x10E3/uL (ref 1.4–7.0)
Neutrophils: 54 %
Platelets: 287 x10E3/uL (ref 150–450)
RBC: 3.99 x10E6/uL (ref 3.77–5.28)
RDW: 11.8 % (ref 11.7–15.4)
WBC: 5.7 x10E3/uL (ref 3.4–10.8)

## 2024-06-19 MED ORDER — WEGOVY 0.25 MG/0.5ML ~~LOC~~ SOAJ: 0.2500 mg | SUBCUTANEOUS | 0 refills | Status: DC

## 2024-06-23 LAB — QUANTIFERON-TB GOLD PLUS
QuantiFERON Mitogen Value: >10 [IU]/mL
QuantiFERON Nil Value: 0.07 [IU]/mL
QuantiFERON TB1 Ag Value: 0.04 [IU]/mL
QuantiFERON TB2 Ag Value: 0.04 [IU]/mL
QuantiFERON-TB Gold Plus: NEGATIVE

## 2024-07-05 ENCOUNTER — Encounter: Payer: Self-pay | Admitting: Internal Medicine

## 2024-07-05 ENCOUNTER — Ambulatory Visit: Payer: Self-pay | Admitting: Internal Medicine

## 2024-07-05 ENCOUNTER — Ambulatory Visit: Admitting: Internal Medicine

## 2024-07-05 DIAGNOSIS — R0981 Nasal congestion: Secondary | ICD-10-CM

## 2024-07-05 DIAGNOSIS — U071 COVID-19: Secondary | ICD-10-CM

## 2024-07-05 LAB — POCT XPERT XPRESS SARS COVID-2/FLU/RSV
FLU A: NEGATIVE
FLU B: NEGATIVE
RSV RNA, PCR: NEGATIVE
SARS Coronavirus 2: POSITIVE

## 2024-07-05 MED ORDER — PAXLOVID (300/100) 20 X 150 MG & 10 X 100MG PO TBPK: 3.0000 | ORAL_TABLET | Freq: Two times a day (BID) | ORAL | 0 refills | Status: DC

## 2024-07-05 NOTE — Progress Notes (Signed)
 Pt. Informed.

## 2024-07-06 ENCOUNTER — Other Ambulatory Visit: Payer: Self-pay

## 2024-07-09 ENCOUNTER — Other Ambulatory Visit: Payer: Self-pay

## 2024-07-13 ENCOUNTER — Ambulatory Visit: Admitting: Internal Medicine

## 2024-07-13 VITALS — BP 100/64 | HR 100 | Temp 97.2°F | Ht 64.0 in | Wt 176.0 lb

## 2024-07-13 DIAGNOSIS — E663 Overweight: Secondary | ICD-10-CM | POA: Diagnosis not present

## 2024-07-13 DIAGNOSIS — J0100 Acute maxillary sinusitis, unspecified: Secondary | ICD-10-CM

## 2024-07-13 DIAGNOSIS — Z0130 Encounter for examination of blood pressure without abnormal findings: Secondary | ICD-10-CM

## 2024-07-13 MED ORDER — WEGOVY 0.25 MG/0.5ML ~~LOC~~ SOAJ: 0.2500 mg | SUBCUTANEOUS | 0 refills | Status: DC

## 2024-07-13 MED ORDER — AMOXICILLIN-POT CLAVULANATE 875-125 MG PO TABS: 1.0000 | ORAL_TABLET | Freq: Two times a day (BID) | ORAL | 0 refills | Status: AC

## 2024-07-13 MED ORDER — WEGOVY 0.5 MG/0.5ML ~~LOC~~ SOAJ: 0.5000 mg | SUBCUTANEOUS | 0 refills | Status: AC

## 2024-07-13 NOTE — Progress Notes (Signed)
 "  Established Patient Office Visit  Subjective:  Patient ID: Cynthia Davidson, Cynthia Davidson    DOB: December 09, 1985  Age: 39 y.o. MRN: [id number]  Chief Complaint  Patient presents with   Acute Visit    Congestion (post covid) med refills     Recovering from Covid and c/o nasal congestion and cough. Also c/o left facial pressure, postnasal drip and greenish nasal discharge. Completed course of Paxlovid .    No other concerns at this time.   Past Medical History:  Diagnosis Date   ADD (attention deficit disorder) 09/11/2016   Anxiety attack    Factor 5 Leiden mutation, heterozygous 07/2016   hematology [place]   Guttate psoriasis    Mastitis, associated with childbirth 02/2012   required hospitalization and IV antibiotics   Panic attack     Past Surgical History:  Procedure Laterality Date   BIOPSY BREAST Bilateral 2008   CESAREAN SECTION  02/26/2012   active phase arrest (FTP)   CESAREAN SECTION WITH BILATERAL TUBAL LIGATION Bilateral 02/12/2021   Procedure: CESAREAN SECTION WITH BILATERAL TUBAL LIGATION;  Surgeon: Eldonna Suzen Octave, MD;  Location: [facility];  Service: Obstetrics;  Laterality: Bilateral;   MANDIBLE SURGERY  2014   for TMJ   SINUS IRRIGATION     TONSILLECTOMY     WISDOM TOOTH EXTRACTION      Social History   Socioeconomic History   Marital status: Single    Spouse name: Not on file   Number of children: 1   Years of education: Not on file   Highest education level: Not on file  Occupational History   Occupation: LPN    Comment: works at [employer]  Tobacco Use   Smoking status: Never   Smokeless tobacco: Never  Vaping Use   Vaping status: Never Used  Substance and Sexual Activity   Alcohol use: Yes    Comment: occasional   Drug use: No   Sexual activity: Yes    Birth control/protection: Surgical    Comment: Tubal Ligation  Other Topics Concern   Not on file  Social History Narrative   Not on file   Social Drivers of Health    Tobacco Use: Low Risk (05/11/2024)   Patient History    Smoking Tobacco Use: Never    Smokeless Tobacco Use: Never    Passive Exposure: Not on file  Financial Resource Strain: Not on file  Food Insecurity: Not on file  Transportation Needs: Not on file  Physical Activity: Not on file  Stress: Not on file  Social Connections: Not on file  Intimate Partner Violence: Not on file  Depression (PHQ2-9): High Risk (05/11/2024)   Depression (PHQ2-9)    PHQ-2 Score: 13  Alcohol Screen: Not on file  Housing: Not on file  Utilities: Not on file  Health Literacy: Not on file    Family History  Problem Relation Age of Onset   Stroke Maternal Uncle    Factor V Leiden deficiency Maternal Uncle    Stroke Paternal Aunt    Factor V Leiden deficiency Paternal Aunt    Stroke Maternal Grandfather    Melanoma Paternal Grandmother    Cerebral aneurysm Paternal Grandmother    Heart disease Paternal Grandfather    Heart attack Paternal Grandfather    Aortic aneurysm Paternal Grandfather     Allergies[1]  Show/hide medication list[2]  Review of Systems  Constitutional:  Positive for weight loss (5 lbs).  HENT: Negative.    Eyes: Negative.  Respiratory: Negative.    Cardiovascular: Negative.   Gastrointestinal: Negative.   Genitourinary: Negative.   Skin: Negative.   Neurological: Negative.   Endo/Heme/Allergies: Negative.        Objective:   BP 100/64   Pulse 100   Temp (!) 97.2 F (36.2 C) (Tympanic)   Ht 5' 4 (1.626 m)   Wt 176 lb (79.8 kg)   SpO2 98%   BMI 30.21 kg/m   Vitals:   07/13/24 1133  BP: 100/64  Pulse: 100  Temp: (!) 97.2 F (36.2 C)  Height: 5' 4 (1.626 m)  Weight: 176 lb (79.8 kg)  SpO2: 98%  TempSrc: Tympanic  BMI (Calculated): 30.2    Physical Exam Vitals reviewed.  Constitutional:      General: She is not in acute distress.    Appearance: She is overweight.  HENT:     Head: Normocephalic.     Comments: Left maxillary sinus  tenderness    Nose: Nose normal.     Mouth/Throat:     Mouth: Mucous membranes are moist.  Eyes:     Extraocular Movements: Extraocular movements intact.     Pupils: Pupils are equal, round, and reactive to light.  Cardiovascular:     Rate and Rhythm: Normal rate and regular rhythm.     Heart sounds: No murmur heard.    Comments: R thigh varicose veins Pulmonary:     Effort: Pulmonary effort is normal.     Breath sounds: No rhonchi or rales.  Abdominal:     General: Abdomen is flat.     Palpations: There is no hepatomegaly, splenomegaly or mass.  Musculoskeletal:        General: Normal range of motion.     Cervical back: Normal range of motion. No tenderness.  Skin:    General: Skin is warm and dry.  Neurological:     General: No focal deficit present.     Mental Status: She is alert and oriented to person, place, and time.     Cranial Nerves: No cranial nerve deficit.     Motor: No weakness.  Psychiatric:        Mood and Affect: Mood normal.        Behavior: Behavior normal.      No results found for any visits on 07/13/24.      Assessment & Plan:  Cynthia Davidson was seen today for acute visit.  Acute non-recurrent maxillary sinusitis -     Amoxicillin -Pot Clavulanate; Take 1 tablet by mouth 2 (two) times daily for 10 days.  Dispense: 20 tablet; Refill: 0  Overweight (BMI 25.0-29.9) -     Wegovy ; Inject 0.5 mg into the skin once a week for 28 days.  Dispense: 2 mL; Refill: 0    Problem List Items Addressed This Visit       Other   Overweight (BMI 25.0-29.9)   Relevant Medications   semaglutide -weight management (WEGOVY ) 0.5 MG/0.5ML SOAJ SQ injection   Other Visit Diagnoses       Acute non-recurrent maxillary sinusitis    -  Primary   Relevant Medications   amoxicillin -clavulanate (AUGMENTIN ) 875-125 MG tablet       No follow-ups on file.   Total time spent: 20 minutes. This time includes review of previous notes and results and patient face to face  interaction during today'Damien Cisar visit.    Sherrill Cinderella Perry, MD  07/13/2024   This document may have been prepared by [facility] Voice Recognition software and as such  may include unintentional dictation errors.     [1]  Allergies Allergen Reactions   Doxycycline Anaphylaxis and Swelling   Morphine  Nausea Only  [2]  Outpatient Medications Prior to Visit  Medication Sig   amphetamine-dextroamphetamine (ADDERALL) 20 MG tablet Take 20 mg by mouth 2 (two) times daily.   buPROPion (WELLBUTRIN XL) 150 MG 24 hr tablet Take 150 mg by mouth every morning. (Patient not taking: Reported on 05/11/2024)   Cholecalciferol (VITAMIN D -3) 125 MCG (5000 UT) TABS Take 5,000 mg by mouth daily as needed (random).   FLUoxetine (PROZAC) 10 MG capsule Take 10 mg by mouth daily.   ipratropium (ATROVENT ) 0.03 % nasal spray Place 2 sprays into both nostrils every 12 (twelve) hours.   valACYclovir  (VALTREX ) 1000 MG tablet Take 1,000 mg by mouth as needed. (Patient not taking: Reported on 05/11/2024)   [DISCONTINUED] nirmatrelvir/ritonavir (PAXLOVID , 300/100,) 20 x 150 MG & 10 x 100MG  TBPK Take 3 tablets by mouth every 12 (twelve) hours.   [DISCONTINUED] semaglutide -weight management (WEGOVY ) 0.25 MG/0.5ML SOAJ SQ injection Inject 0.25 mg into the skin once a week for 28 days. PA Approved   No facility-administered medications prior to visit.   "

## 2024-07-24 ENCOUNTER — Encounter (INDEPENDENT_AMBULATORY_CARE_PROVIDER_SITE_OTHER): Payer: Self-pay | Admitting: Vascular Surgery

## 2024-07-31 ENCOUNTER — Ambulatory Visit (INDEPENDENT_AMBULATORY_CARE_PROVIDER_SITE_OTHER): Admitting: Vascular Surgery

## 2024-08-14 ENCOUNTER — Ambulatory Visit (INDEPENDENT_AMBULATORY_CARE_PROVIDER_SITE_OTHER): Admitting: Vascular Surgery

## 2024-08-14 ENCOUNTER — Ambulatory Visit: Admitting: Internal Medicine

## 2024-08-17 ENCOUNTER — Ambulatory Visit: Admitting: Internal Medicine
